# Patient Record
Sex: Male | Born: 1955 | Race: White | Hispanic: No | Marital: Married
Health system: Southern US, Community
[De-identification: ages and names within clinical notes are randomized; demographics above are authoritative.]

---

## 2016-10-14 DIAGNOSIS — D473 Essential (hemorrhagic) thrombocythemia: Secondary | ICD-10-CM | POA: Insufficient documentation

## 2016-11-12 DIAGNOSIS — D72829 Elevated white blood cell count, unspecified: Secondary | ICD-10-CM | POA: Diagnosis not present

## 2016-11-12 DIAGNOSIS — E611 Iron deficiency: Secondary | ICD-10-CM | POA: Diagnosis not present

## 2016-11-12 DIAGNOSIS — D473 Essential (hemorrhagic) thrombocythemia: Secondary | ICD-10-CM | POA: Diagnosis not present

## 2016-11-12 DIAGNOSIS — Z716 Tobacco abuse counseling: Secondary | ICD-10-CM | POA: Diagnosis not present

## 2016-11-26 DIAGNOSIS — F17210 Nicotine dependence, cigarettes, uncomplicated: Secondary | ICD-10-CM | POA: Diagnosis not present

## 2016-11-26 DIAGNOSIS — D473 Essential (hemorrhagic) thrombocythemia: Secondary | ICD-10-CM | POA: Diagnosis not present

## 2016-11-26 DIAGNOSIS — E611 Iron deficiency: Secondary | ICD-10-CM | POA: Diagnosis not present

## 2016-11-26 DIAGNOSIS — K089 Disorder of teeth and supporting structures, unspecified: Secondary | ICD-10-CM | POA: Diagnosis not present

## 2016-11-26 DIAGNOSIS — R109 Unspecified abdominal pain: Secondary | ICD-10-CM | POA: Diagnosis not present

## 2016-12-17 DIAGNOSIS — D473 Essential (hemorrhagic) thrombocythemia: Secondary | ICD-10-CM | POA: Diagnosis not present

## 2016-12-17 DIAGNOSIS — R110 Nausea: Secondary | ICD-10-CM | POA: Diagnosis not present

## 2016-12-17 DIAGNOSIS — E875 Hyperkalemia: Secondary | ICD-10-CM | POA: Diagnosis not present

## 2016-12-17 DIAGNOSIS — Z862 Personal history of diseases of the blood and blood-forming organs and certain disorders involving the immune mechanism: Secondary | ICD-10-CM | POA: Diagnosis not present

## 2017-11-11 DIAGNOSIS — D473 Essential (hemorrhagic) thrombocythemia: Secondary | ICD-10-CM

## 2017-11-11 DIAGNOSIS — Z79899 Other long term (current) drug therapy: Secondary | ICD-10-CM

## 2018-07-23 DIAGNOSIS — I340 Nonrheumatic mitral (valve) insufficiency: Secondary | ICD-10-CM

## 2018-07-23 DIAGNOSIS — D440 Neoplasm of uncertain behavior of thyroid gland: Secondary | ICD-10-CM

## 2018-07-23 DIAGNOSIS — I2510 Atherosclerotic heart disease of native coronary artery without angina pectoris: Secondary | ICD-10-CM

## 2018-07-23 DIAGNOSIS — S72144A Nondisplaced intertrochanteric fracture of right femur, initial encounter for closed fracture: Secondary | ICD-10-CM

## 2018-07-23 DIAGNOSIS — M25551 Pain in right hip: Secondary | ICD-10-CM

## 2018-07-23 DIAGNOSIS — D473 Essential (hemorrhagic) thrombocythemia: Secondary | ICD-10-CM

## 2018-07-24 DIAGNOSIS — D473 Essential (hemorrhagic) thrombocythemia: Secondary | ICD-10-CM

## 2018-07-26 DIAGNOSIS — D473 Essential (hemorrhagic) thrombocythemia: Secondary | ICD-10-CM

## 2018-08-02 DIAGNOSIS — D473 Essential (hemorrhagic) thrombocythemia: Secondary | ICD-10-CM

## 2018-12-13 DIAGNOSIS — D473 Essential (hemorrhagic) thrombocythemia: Secondary | ICD-10-CM

## 2019-10-19 DIAGNOSIS — D473 Essential (hemorrhagic) thrombocythemia: Secondary | ICD-10-CM

## 2019-10-19 DIAGNOSIS — D72829 Elevated white blood cell count, unspecified: Secondary | ICD-10-CM

## 2019-10-19 DIAGNOSIS — D509 Iron deficiency anemia, unspecified: Secondary | ICD-10-CM

## 2020-02-28 ENCOUNTER — Other Ambulatory Visit: Payer: Self-pay | Admitting: Hematology and Oncology

## 2020-02-28 DIAGNOSIS — D473 Essential (hemorrhagic) thrombocythemia: Secondary | ICD-10-CM

## 2020-02-29 ENCOUNTER — Inpatient Hospital Stay: Payer: Self-pay | Attending: Hematology and Oncology

## 2020-02-29 ENCOUNTER — Inpatient Hospital Stay: Payer: Self-pay | Admitting: Hematology and Oncology

## 2020-02-29 NOTE — Progress Notes (Deleted)
[facility]  [address] [place],  [place]  [address] [phone]  Clinic Day:  02/29/2020  Referring physician: No ref. provider found   CHIEF COMPLAINT:  CC: essential thrombocythemia  Current Treatment:   hydroxyurea   HISTORY OF PRESENT ILLNESS:  Jack Bryan is a 65 y.o. male with essential thrombocythemia.  He had not seen a doctor for 45 years, but gave a history of a dangerously high platelet count in February 2018, for which he placed on some form of pills by a hematologist.  He stopped taking those on his own and his platelet count was found to be over 1 million in September 2018.  We began seeing him in October, at which time his platelet count was over 1.87 million.  He had evidence of iron deficiency, but was not anemic. He also had leukocytosis. We recommended that he start hydroxyurea 500 mg 3 times daily.  JAK 2 mutation was negative.  His platelet count had come down from 1.87 million to 815,000 later in October, but we still recommended increasing the hydroxyurea to 4 times daily.  In November, his platelet count had normalized at 268,000, so we decreased the hydroxyurea to 3 times daily.  He did not come back for follow up after that.  His wife died in 01-08-2017 and he became depressed and stopped taking his medication and was not eating.  He developed flu-like symptoms  He has a history of coronary artery disease and had coronary artery bypass surgery in February 2018.  He seemed to understand that he is at high risk for cardiovascular events and blood clots with the high platelet count.  He had  He has poor dental hygiene and most likely needs extractions of his teeth.  He is on some form of heart medication but could not tell me what.  He was seen by Dr. Lyda Jester the following week for iron deficiency anemia as well.  He was scheduled for upper endoscopy and colonoscopy but needed to hold his Plavix first.  He has chronic  pain of his back and leg and was attending a pain management Clinic last year.  He was seen on November 14th with a normal platelet count of 268,000 and I told him he could reduce his hydroxyurea dose to 500 mg 3 times daily.  He never came back for follow-up and stopped taking the medication.  He explains to me that his wife expired in 01/08/17 and he became quite depressed and gave up on everything.  He admits he was not eating or taking his medications.   He developed flu-like symptoms.  His primary care provider checked a blood count in September 2019 and the platelet count was 2.6 million.  A repeat the following week was even higher at 2.76 million with a white count of 34,000 and hemoglobin of 13.9.  When repeated again, the white count was 30,000, but the platelet count was over 3 million.  The lab results were forwarded to [place], so we contacted the patient and he agreed to get back on medication.  We told him we could give him a month supply so that he could get an appointment here, but that we could not continue to fill this unless he followed up with [place].  He started back on hydroxyurea 500 mg 4 times daily in September.  He was seen in October 2019 with improvement of his thrombocytosis with a platelet count of 828,000.  Unfortunately,  he never came back for follow up and discontinued the hydroxyurea once he ran out.   He had a fall in June 2020 and was seen in the ER with right hip pain.  X-ray revealed that he had a nondisplaced right intertrochanteric fracture.  He underwent surgery to have a intramedullary nailing of the fracture. His platelet count was over 2 million at that time, so he was placed on hydroxyurea 500 mg, 6 pills daily.  Hydroxyurea was decreased to 4 times daily when he left the hospital.  We saw him in June 2020 after he was discharged from the hospital.  His platelet count was 1.48 million, so we had him continue hydroxyurea 500 mg 4 times daily.   He was seen in November  2020 and his platelet count had normalized again, so we decreased the hydroxyurea to 3 times daily.  He then failed to follow up again.   He was seen in September 2021 and his platelet count was 122,000, so we held hydroxyurea.  We recommended a repeat CBC in 1 month prior to resuming hydroxyurea, but he did not come in for that appointment.    INTERVAL HISTORY:  Jack Bryan is here today for routine follow up of his ***. He denies fevers or chills. He  denies pain. His appetite is good. His weight {Weight change:10426}.  REVIEW OF SYSTEMS:  Review of Systems - Oncology   VITALS:  There were no vitals taken for this visit.  Wt Readings from Last 3 Encounters:  No data found for Wt    There is no height or weight on file to calculate BMI.  Performance status (ECOG): {CHL ONC Q3448304  PHYSICAL EXAM:  Physical Exam  LABS:  No flowsheet data found. No flowsheet data found.   No results found for: CEA1 / No results found for: CEA1 No results found for: PSA1 No results found for: IOE703 No results found for: CAN125  No results found for: TOTALPROTELP, ALBUMINELP, A1GS, A2GS, BETS, BETA2SER, GAMS, MSPIKE, SPEI No results found for: TIBC, FERRITIN, IRONPCTSAT No results found for: LDH  STUDIES:  No results found.    HISTORY:  No past medical history on file.  *** The histories are not reviewed yet. Please review them in the "History" navigator section and refresh this [place].  No family history on file.  Social History:  has no history on file for tobacco use, alcohol use, and drug use.The patient is {Blank single:19197::"alone","accompanied by"} *** today.  Allergies: Not on File  Current Medications: No current outpatient medications on file.   No current facility-administered medications for this visit.     ASSESSMENT & PLAN:   Assessment:  Errin Chewning is a 65 y.o. male ***  Plan: ***  The patient understands the plans discussed today and is in  agreement with them.  He knows to contact our office if he develops concerns prior to his next appointment.   I provided *** minutes (10:58 AM - 10:58 AM) of face-to-face time during this this encounter and > 50% was spent counseling as documented under my assessment and plan.    Marvia Pickles, PA-C

## 2020-07-04 ENCOUNTER — Ambulatory Visit: Payer: Self-pay | Admitting: Hematology and Oncology

## 2020-07-04 ENCOUNTER — Other Ambulatory Visit: Payer: Self-pay

## 2020-07-04 NOTE — Progress Notes (Deleted)
[facility]  [address] [place],    [address] [phone]  Clinic Day:  07/04/2020  Referring physician: No ref. provider found   CHIEF COMPLAINT:  CC: ***  Current Treatment:  ***   HISTORY OF PRESENT ILLNESS:  Jack Bryan is a 65 y.o. male with a essential thrombocythemia who we began seeing in October 2018.  He reported not seeing a doctor for 45 years, but in February 2018 he had a dangerously high platelet count and was placed on some form of pills by a hematologist.  He stopped taking those on his own and his platelet count was found to be over 1 million, thenup to 1.95 million in September 2018 prior to seeing [place].  He had evidence of iron deficiency, but was not anemic, and also had leukocytosis. He was seen by Dr. Lyda Jester for iron deficiency anemia.   He recommended an EGD and colonoscopy, which was never done. We started him on hydroxyurea at 500 mg 3 times daily.  He was found to be negative for JAK 2 mutation. His platelet count remained elevated over a million later in October, so the hydroxyurea was increased to 4 times daily. He was seen in November 2018 with a normal platelet count of 268,000, so we reduced his hydroxyurea dose to 500 mg 3 times daily.  We typically give a 6 month supply of medication at a time.  He did not follow-up and stopped taking the medication when he ran out.  He saw his primary care provider in September 2019 and had a platelet count of 2.6 million, the platelet count continued to rise over several weeks and he had persistent leukocytosis, so the lab results were sent to [place]. We contacted the patient and he agreed to get back on hydroxyurea 500 mg 4 times daily.  We gave him a 1 month supply until he could get an appointment here, stating we could not continue to fill this unless he came in for appointments.  He was seen in October 2019 with improvement of his thrombocytosis, but never came back for  follow up and once again discontinued hydroxyurea when he ran out.   He had a fall in June 2020, and went to the ER with right hip pain.  X-ray revealed that he had a nondisplaced right intertrochanteric fracture, and he underwent surgery to have a intramedullary nailing of the fracture. His platelet count was over 2 million, so he was placed back on hydroxyurea 6 pills daily initially, and then 4 times daily when he left the hospital.  We saw him later in June and continued hydroxyurea 4 times daily, recommending he be seen in 1-2 months.  He finally followed up in November, at which time his platelet count and white count were normal, so we decreased the hydroxyurea to 3 times daily and recommended follow-up in 3-4 months. Once again, he failed to follow-up despite multiple phone calls and letters from our office.  He requested a refill of hydroxyurea in May 2021 and we gave him a month's supply until he could be seen.  He was not seen until September , at which time his platelet count was 122,000 and hemoglobin 10.8.  We therefore held hydroxyurea and planned to repeat a CBC in 1 month. He did not come for that appointment.  He was hospitalized at [facility] on May 19th with pneumonia secondary to influenza.  He was also felt to have a  transient ischemic attack and a non STEMI acute MI. He was found to have a platelet count of 1.66 million, so started on hydroxyurea 500 mg 3 times daily.  He left the hospital against medical advice on May 22nd at which time his platelet count was 1.35 million. He did follow-up with [facility] transitional care yesterday.  No labs were drawn, but he was continued on metoprolol 12.5 mg twice daily and aspirin 81 mg daily, in addition to the hydroxyurea.  He was instructed to keep his appointment with [place] today.  INTERVAL HISTORY:  Jack Bryan is here today for repeat clinical assessment. He denies fevers or chills. He denies pain. His appetite is  good. His weight {Weight change:10426}.  REVIEW OF SYSTEMS:  Review of Systems  Constitutional: Negative for appetite change, chills, fatigue, fever and unexpected weight change.  HENT:   Negative for lump/mass, mouth sores and sore throat.   Respiratory: Negative for cough and shortness of breath.   Cardiovascular: Negative for chest pain and leg swelling.  Gastrointestinal: Negative for abdominal pain, constipation, diarrhea, nausea and vomiting.  Genitourinary: Negative for difficulty urinating, dysuria, frequency and hematuria.   Musculoskeletal: Negative for arthralgias, back pain and myalgias.  Skin: Negative for itching, rash and wound.  Neurological: Negative for dizziness, extremity weakness, headaches, light-headedness and numbness.  Hematological: Negative for adenopathy.  Psychiatric/Behavioral: Negative for depression and sleep disturbance. The patient is not nervous/anxious.      VITALS:  There were no vitals taken for this visit.  Wt Readings from Last 3 Encounters:  No data found for Wt    There is no height or weight on file to calculate BMI.  Performance status (ECOG): {CHL ONC [id number]  PHYSICAL EXAM:  Physical Exam Vitals and nursing note reviewed.  Constitutional:      General: He is not in acute distress.    Appearance: Normal appearance. He is normal weight.  HENT:     Head: Normocephalic and atraumatic.     Mouth/Throat:     Mouth: Mucous membranes are moist.     Pharynx: Oropharynx is clear. No oropharyngeal exudate or posterior oropharyngeal erythema.  Eyes:     General: No scleral icterus.    Extraocular Movements: Extraocular movements intact.     Conjunctiva/sclera: Conjunctivae normal.     Pupils: Pupils are equal, round, and reactive to light.  Cardiovascular:     Rate and Rhythm: Normal rate and regular rhythm.     Heart sounds: Normal heart sounds. No murmur heard. No friction rub. No gallop.   Pulmonary:     Effort: Pulmonary  effort is normal.     Breath sounds: Normal breath sounds. No wheezing, rhonchi or rales.  Chest:  Breasts:     Right: No axillary adenopathy or supraclavicular adenopathy.     Left: No axillary adenopathy or supraclavicular adenopathy.    Abdominal:     General: Bowel sounds are normal. There is no distension.     Palpations: Abdomen is soft. There is no hepatomegaly, splenomegaly or mass.     Tenderness: There is no abdominal tenderness.  Musculoskeletal:        General: Normal range of motion.     Cervical back: Normal range of motion and neck supple. No tenderness.     Right lower leg: No edema.     Left lower leg: No edema.  Lymphadenopathy:     Cervical: No cervical adenopathy.     Upper Body:     Right  upper body: No supraclavicular or axillary adenopathy.     Left upper body: No supraclavicular or axillary adenopathy.     Lower Body: No right inguinal adenopathy. No left inguinal adenopathy.  Skin:    General: Skin is warm and dry.     Coloration: Skin is not jaundiced.     Findings: No rash.  Neurological:     Mental Status: He is alert and oriented to person, place, and time.     Cranial Nerves: No cranial nerve deficit.  Psychiatric:        Mood and Affect: Mood normal.        Behavior: Behavior normal.        Thought Content: Thought content normal.     LABS:  No flowsheet data found. No flowsheet data found.   No results found for: CEA1 / No results found for: CEA1 No results found for: PSA1 No results found for: MHD622 No results found for: CAN125  No results found for: TOTALPROTELP, ALBUMINELP, A1GS, A2GS, BETS, BETA2SER, GAMS, MSPIKE, SPEI No results found for: TIBC, FERRITIN, IRONPCTSAT No results found for: LDH  STUDIES:  No results found.    HISTORY:  No past medical history on file.  *** The histories are not reviewed yet. Please review them in the "History" navigator section and refresh this [place].  No family history on  file.  Social History:  has no history on file for tobacco use, alcohol use, and drug use.The patient is {Blank single:19197::"alone","accompanied by"} *** today.  Allergies: Not on File  Current Medications: No current outpatient medications on file.   No current facility-administered medications for this visit.     ASSESSMENT & PLAN:   Assessment:  Jack Bryan is a 65 y.o. male ***  Plan: ***  The patient understands the plans discussed today and is in agreement with them.  He knows to contact our office if he develops concerns prior to his next appointment.   I provided *** minutes (8:51 AM - 8:51 AM) of face-to-face time during this this encounter and > 50% was spent counseling as documented under my assessment and plan.    Marvia Pickles, PA-C

## 2020-08-14 ENCOUNTER — Inpatient Hospital Stay: Payer: Medicaid Other

## 2020-08-14 ENCOUNTER — Inpatient Hospital Stay: Payer: Medicaid Other | Attending: Oncology | Admitting: Oncology

## 2020-09-20 DIAGNOSIS — D473 Essential (hemorrhagic) thrombocythemia: Secondary | ICD-10-CM

## 2020-09-26 ENCOUNTER — Telehealth: Payer: Self-pay | Admitting: Hematology and Oncology

## 2020-09-26 NOTE — Telephone Encounter (Signed)
Patient's daughter called to scheduled Hospital Follow Up after discharge on 8/20.  Appt made 8/25 Labs 1:30 pm, Hosp F/U w/Kelli 2:00 pm

## 2020-09-27 ENCOUNTER — Other Ambulatory Visit: Payer: Medicaid Other

## 2020-09-27 ENCOUNTER — Ambulatory Visit: Payer: Medicaid Other | Admitting: Hematology and Oncology

## 2020-10-02 ENCOUNTER — Other Ambulatory Visit: Payer: Medicaid Other

## 2020-10-02 ENCOUNTER — Ambulatory Visit: Payer: Medicaid Other | Admitting: Hematology and Oncology

## 2020-10-02 DIAGNOSIS — I361 Nonrheumatic tricuspid (valve) insufficiency: Secondary | ICD-10-CM

## 2020-10-03 DIAGNOSIS — I340 Nonrheumatic mitral (valve) insufficiency: Secondary | ICD-10-CM

## 2020-10-03 DIAGNOSIS — I420 Dilated cardiomyopathy: Secondary | ICD-10-CM

## 2020-10-03 DIAGNOSIS — I272 Pulmonary hypertension, unspecified: Secondary | ICD-10-CM

## 2020-10-03 DIAGNOSIS — I5020 Unspecified systolic (congestive) heart failure: Secondary | ICD-10-CM

## 2020-10-03 DIAGNOSIS — J441 Chronic obstructive pulmonary disease with (acute) exacerbation: Secondary | ICD-10-CM

## 2020-10-03 DIAGNOSIS — I2510 Atherosclerotic heart disease of native coronary artery without angina pectoris: Secondary | ICD-10-CM

## 2020-10-03 DIAGNOSIS — J9600 Acute respiratory failure, unspecified whether with hypoxia or hypercapnia: Secondary | ICD-10-CM

## 2020-10-04 ENCOUNTER — Encounter (HOSPITAL_COMMUNITY)
Admission: AD | Disposition: A | Discharge status: Home / Self Care | Payer: Self-pay | Source: Other Acute Inpatient Hospital | Attending: Student

## 2020-10-04 ENCOUNTER — Inpatient Hospital Stay (HOSPITAL_COMMUNITY): Payer: Medicaid Other

## 2020-10-04 ENCOUNTER — Encounter (HOSPITAL_COMMUNITY): Payer: Self-pay | Admitting: Internal Medicine

## 2020-10-04 ENCOUNTER — Inpatient Hospital Stay (HOSPITAL_COMMUNITY)
Admission: AD | Admit: 2020-10-04 | Discharge: 2020-10-05 | DRG: 286 | Disposition: A | Discharge status: Home / Self Care | Payer: Medicaid Other | Source: Other Acute Inpatient Hospital | Attending: Student | Admitting: Student

## 2020-10-04 DIAGNOSIS — J449 Chronic obstructive pulmonary disease, unspecified: Secondary | ICD-10-CM | POA: Diagnosis present

## 2020-10-04 DIAGNOSIS — D75839 Thrombocytosis, unspecified: Secondary | ICD-10-CM | POA: Diagnosis present

## 2020-10-04 DIAGNOSIS — R000 Tachycardia, unspecified: Secondary | ICD-10-CM

## 2020-10-04 DIAGNOSIS — I110 Hypertensive heart disease with heart failure: Secondary | ICD-10-CM | POA: Diagnosis present

## 2020-10-04 DIAGNOSIS — G8929 Other chronic pain: Secondary | ICD-10-CM | POA: Diagnosis present

## 2020-10-04 DIAGNOSIS — I255 Ischemic cardiomyopathy: Secondary | ICD-10-CM | POA: Diagnosis present

## 2020-10-04 DIAGNOSIS — I2510 Atherosclerotic heart disease of native coronary artery without angina pectoris: Secondary | ICD-10-CM | POA: Diagnosis present

## 2020-10-04 DIAGNOSIS — Z951 Presence of aortocoronary bypass graft: Secondary | ICD-10-CM | POA: Diagnosis not present

## 2020-10-04 DIAGNOSIS — R911 Solitary pulmonary nodule: Secondary | ICD-10-CM | POA: Diagnosis present

## 2020-10-04 DIAGNOSIS — Z8673 Personal history of transient ischemic attack (TIA), and cerebral infarction without residual deficits: Secondary | ICD-10-CM

## 2020-10-04 DIAGNOSIS — R64 Cachexia: Secondary | ICD-10-CM | POA: Diagnosis present

## 2020-10-04 DIAGNOSIS — F17200 Nicotine dependence, unspecified, uncomplicated: Secondary | ICD-10-CM | POA: Diagnosis present

## 2020-10-04 DIAGNOSIS — I083 Combined rheumatic disorders of mitral, aortic and tricuspid valves: Secondary | ICD-10-CM | POA: Diagnosis present

## 2020-10-04 DIAGNOSIS — Z681 Body mass index (BMI) 19 or less, adult: Secondary | ICD-10-CM

## 2020-10-04 DIAGNOSIS — Z20822 Contact with and (suspected) exposure to covid-19: Secondary | ICD-10-CM | POA: Diagnosis present

## 2020-10-04 DIAGNOSIS — E785 Hyperlipidemia, unspecified: Secondary | ICD-10-CM | POA: Diagnosis present

## 2020-10-04 DIAGNOSIS — D751 Secondary polycythemia: Secondary | ICD-10-CM | POA: Diagnosis present

## 2020-10-04 DIAGNOSIS — I5023 Acute on chronic systolic (congestive) heart failure: Secondary | ICD-10-CM | POA: Diagnosis present

## 2020-10-04 DIAGNOSIS — I509 Heart failure, unspecified: Secondary | ICD-10-CM

## 2020-10-04 DIAGNOSIS — E43 Unspecified severe protein-calorie malnutrition: Secondary | ICD-10-CM | POA: Diagnosis present

## 2020-10-04 DIAGNOSIS — I5041 Acute combined systolic (congestive) and diastolic (congestive) heart failure: Secondary | ICD-10-CM

## 2020-10-04 DIAGNOSIS — Z720 Tobacco use: Secondary | ICD-10-CM

## 2020-10-04 HISTORY — PX: RIGHT/LEFT HEART CATH AND CORONARY/GRAFT ANGIOGRAPHY: CATH118267

## 2020-10-04 LAB — POCT I-STAT 7, (LYTES, BLD GAS, ICA,H+H)
Acid-Base Excess: 2 mmol/L (ref 0.0–2.0)
Bicarbonate: 25.3 mmol/L (ref 20.0–28.0)
Calcium, Ion: 0.71 mmol/L — CL (ref 1.15–1.40)
HCT: 32 % — ABNORMAL LOW (ref 39.0–52.0)
Hemoglobin: 10.9 g/dL — ABNORMAL LOW (ref 13.0–17.0)
O2 Saturation: 95 %
Potassium: 2.6 mmol/L — CL (ref 3.5–5.1)
Sodium: 149 mmol/L — ABNORMAL HIGH (ref 135–145)
TCO2: 26 mmol/L (ref 22–32)
pCO2 arterial: 34.2 mmHg (ref 32.0–48.0)
pH, Arterial: 7.477 — ABNORMAL HIGH (ref 7.350–7.450)
pO2, Arterial: 68 mmHg — ABNORMAL LOW (ref 83.0–108.0)

## 2020-10-04 LAB — POCT I-STAT EG7
Acid-Base Excess: 9 mmol/L — ABNORMAL HIGH (ref 0.0–2.0)
Acid-Base Excess: 7 mmol/L — ABNORMAL HIGH (ref 0.0–2.0)
Bicarbonate: 33.8 mmol/L — ABNORMAL HIGH (ref 20.0–28.0)
Bicarbonate: 31.4 mmol/L — ABNORMAL HIGH (ref 20.0–28.0)
Calcium, Ion: 1.13 mmol/L — ABNORMAL LOW (ref 1.15–1.40)
Calcium, Ion: 0.99 mmol/L — ABNORMAL LOW (ref 1.15–1.40)
HCT: 42 % (ref 39.0–52.0)
HCT: 39 % (ref 39.0–52.0)
Hemoglobin: 14.3 g/dL (ref 13.0–17.0)
Hemoglobin: 13.3 g/dL (ref 13.0–17.0)
O2 Saturation: 58 %
O2 Saturation: 62 %
Potassium: 3.7 mmol/L (ref 3.5–5.1)
Potassium: 3.2 mmol/L — ABNORMAL LOW (ref 3.5–5.1)
Sodium: 140 mmol/L (ref 135–145)
Sodium: 143 mmol/L (ref 135–145)
TCO2: 35 mmol/L — ABNORMAL HIGH (ref 22–32)
TCO2: 33 mmol/L — ABNORMAL HIGH (ref 22–32)
pCO2, Ven: 47.2 mmHg (ref 44.0–60.0)
pCO2, Ven: 43.9 mmHg — ABNORMAL LOW (ref 44.0–60.0)
pH, Ven: 7.463 — ABNORMAL HIGH (ref 7.250–7.430)
pH, Ven: 7.462 — ABNORMAL HIGH (ref 7.250–7.430)
pO2, Ven: 29 mmHg — CL (ref 32.0–45.0)
pO2, Ven: 31 mmHg — CL (ref 32.0–45.0)

## 2020-10-04 LAB — ECHOCARDIOGRAM COMPLETE
AR max vel: 3.08 cm2
AV Area VTI: 2.39 cm2
AV Area mean vel: 2.61 cm2
AV Mean grad: 5.2 mmHg
AV Peak grad: 8.5 mmHg
Ao pk vel: 1.46 m/s
Area-P 1/2: 8.25 cm2
Calc EF: 12.8 %
Height: 72 in
MV M vel: 5.11 m/s
MV Peak grad: 104.4 mmHg
Radius: 0.7 cm
S' Lateral: 6 cm
Single Plane A2C EF: 13 %
Single Plane A4C EF: 14.5 %
Weight: 1886.4 [oz_av]

## 2020-10-04 LAB — CBC WITH DIFFERENTIAL/PLATELET
Abs Immature Granulocytes: 0 K/uL (ref 0.00–0.07)
Basophils Absolute: 0 K/uL (ref 0.0–0.1)
Basophils Relative: 0 %
Eosinophils Absolute: 0.1 K/uL (ref 0.0–0.5)
Eosinophils Relative: 1 %
HCT: 41 % (ref 39.0–52.0)
Hemoglobin: 13.1 g/dL (ref 13.0–17.0)
Lymphocytes Relative: 15 %
Lymphs Abs: 2 K/uL (ref 0.7–4.0)
MCH: 32.2 pg (ref 26.0–34.0)
MCHC: 32 g/dL (ref 30.0–36.0)
MCV: 100.7 fL — ABNORMAL HIGH (ref 80.0–100.0)
Monocytes Absolute: 0.5 K/uL (ref 0.1–1.0)
Monocytes Relative: 4 %
Neutro Abs: 10.6 K/uL — ABNORMAL HIGH (ref 1.7–7.7)
Neutrophils Relative %: 80 %
Platelets: 771 K/uL — ABNORMAL HIGH (ref 150–400)
RBC: 4.07 MIL/uL — ABNORMAL LOW (ref 4.22–5.81)
RDW: 24.5 % — ABNORMAL HIGH (ref 11.5–15.5)
WBC: 13.2 K/uL — ABNORMAL HIGH (ref 4.0–10.5)
nRBC: 0 % (ref 0.0–0.2)
nRBC: 0 /100{WBCs}

## 2020-10-04 LAB — BRAIN NATRIURETIC PEPTIDE: B Natriuretic Peptide: 1084.9 pg/mL — ABNORMAL HIGH (ref 0.0–100.0)

## 2020-10-04 LAB — COMPREHENSIVE METABOLIC PANEL WITH GFR
ALT: 27 U/L (ref 0–44)
AST: 20 U/L (ref 15–41)
Albumin: 3.3 g/dL — ABNORMAL LOW (ref 3.5–5.0)
Alkaline Phosphatase: 80 U/L (ref 38–126)
Anion gap: 9 (ref 5–15)
BUN: 17 mg/dL (ref 8–23)
CO2: 30 mmol/L (ref 22–32)
Calcium: 8.7 mg/dL — ABNORMAL LOW (ref 8.9–10.3)
Chloride: 97 mmol/L — ABNORMAL LOW (ref 98–111)
Creatinine, Ser: 0.65 mg/dL (ref 0.61–1.24)
GFR, Estimated: >60 mL/min
Glucose, Bld: 101 mg/dL — ABNORMAL HIGH (ref 70–99)
Potassium: 3.7 mmol/L (ref 3.5–5.1)
Sodium: 136 mmol/L (ref 135–145)
Total Bilirubin: 0.9 mg/dL (ref 0.3–1.2)
Total Protein: 5.8 g/dL — ABNORMAL LOW (ref 6.5–8.1)

## 2020-10-04 LAB — PROTIME-INR
INR: 1.2 (ref 0.8–1.2)
Prothrombin Time: 15.5 s — ABNORMAL HIGH (ref 11.4–15.2)

## 2020-10-04 LAB — SARS CORONAVIRUS 2 BY RT PCR (HOSPITAL ORDER, PERFORMED IN ~~LOC~~ HOSPITAL LAB): SARS Coronavirus 2: NEGATIVE

## 2020-10-04 LAB — HIV ANTIBODY (ROUTINE TESTING W REFLEX): HIV Screen 4th Generation wRfx: NONREACTIVE

## 2020-10-04 LAB — MAGNESIUM: Magnesium: 2.1 mg/dL (ref 1.7–2.4)

## 2020-10-04 LAB — TSH: TSH: 3.921 u[IU]/mL (ref 0.350–4.500)

## 2020-10-04 SURGERY — RIGHT/LEFT HEART CATH AND CORONARY/GRAFT ANGIOGRAPHY
Anesthesia: LOCAL

## 2020-10-04 MED ORDER — FUROSEMIDE 10 MG/ML IJ SOLN
40.0000 mg | Freq: Two times a day (BID) | INTRAMUSCULAR | Status: DC
  Administered 2020-10-04: 40 mg via INTRAVENOUS
  Filled 2020-10-04: qty 4

## 2020-10-04 MED ORDER — SODIUM CHLORIDE 0.9 % IV SOLN: INTRAVENOUS | Status: AC

## 2020-10-04 MED ORDER — LIDOCAINE HCL (PF) 1 % IJ SOLN
INTRAMUSCULAR | Status: DC | PRN
  Administered 2020-10-04 (×2): 2 mL

## 2020-10-04 MED ORDER — HEPARIN (PORCINE) IN NACL 1000-0.9 UT/500ML-% IV SOLN
INTRAVENOUS | Status: DC | PRN
  Administered 2020-10-04 (×2): 500 mL

## 2020-10-04 MED ORDER — HEPARIN SODIUM (PORCINE) 1000 UNIT/ML IJ SOLN
INTRAMUSCULAR | Status: AC
  Filled 2020-10-04: qty 1

## 2020-10-04 MED ORDER — LIDOCAINE HCL (PF) 1 % IJ SOLN
INTRAMUSCULAR | Status: AC
  Filled 2020-10-04: qty 30

## 2020-10-04 MED ORDER — FENTANYL CITRATE (PF) 100 MCG/2ML IJ SOLN
INTRAMUSCULAR | Status: AC
  Filled 2020-10-04: qty 2

## 2020-10-04 MED ORDER — SODIUM CHLORIDE 0.9% FLUSH: 3.0000 mL | INTRAVENOUS | Status: DC | PRN

## 2020-10-04 MED ORDER — MIDAZOLAM HCL 2 MG/2ML IJ SOLN
INTRAMUSCULAR | Status: AC
  Filled 2020-10-04: qty 2

## 2020-10-04 MED ORDER — ATORVASTATIN CALCIUM 80 MG PO TABS
80.0000 mg | ORAL_TABLET | Freq: Every day | ORAL | Status: DC
  Administered 2020-10-04 – 2020-10-05 (×2): 80 mg via ORAL
  Filled 2020-10-04 (×2): qty 1

## 2020-10-04 MED ORDER — SODIUM CHLORIDE 0.9% FLUSH
3.0000 mL | Freq: Two times a day (BID) | INTRAVENOUS | Status: DC
  Administered 2020-10-04 – 2020-10-05 (×3): 3 mL via INTRAVENOUS

## 2020-10-04 MED ORDER — SODIUM CHLORIDE 0.9 % IV SOLN: 250.0000 mL | INTRAVENOUS | Status: DC | PRN

## 2020-10-04 MED ORDER — IOHEXOL 350 MG/ML SOLN
INTRAVENOUS | Status: DC | PRN
  Administered 2020-10-04: 80 mL via INTRA_ARTERIAL

## 2020-10-04 MED ORDER — SODIUM CHLORIDE 0.9% FLUSH
3.0000 mL | Freq: Two times a day (BID) | INTRAVENOUS | Status: DC
  Administered 2020-10-04 – 2020-10-05 (×2): 3 mL via INTRAVENOUS

## 2020-10-04 MED ORDER — ONDANSETRON HCL 4 MG/2ML IJ SOLN: 4.0000 mg | Freq: Four times a day (QID) | INTRAMUSCULAR | Status: DC | PRN

## 2020-10-04 MED ORDER — HEPARIN SODIUM (PORCINE) 1000 UNIT/ML IJ SOLN
INTRAMUSCULAR | Status: DC | PRN
  Administered 2020-10-04: 2500 [IU] via INTRAVENOUS

## 2020-10-04 MED ORDER — ACETAMINOPHEN 325 MG PO TABS: 650.0000 mg | ORAL_TABLET | ORAL | Status: DC | PRN

## 2020-10-04 MED ORDER — CAPTOPRIL 12.5 MG PO TABS
6.2500 mg | ORAL_TABLET | Freq: Three times a day (TID) | ORAL | Status: DC
  Filled 2020-10-04: qty 0.5

## 2020-10-04 MED ORDER — HEPARIN (PORCINE) IN NACL 1000-0.9 UT/500ML-% IV SOLN
INTRAVENOUS | Status: AC
  Filled 2020-10-04: qty 1000

## 2020-10-04 MED ORDER — SODIUM CHLORIDE 0.9 % IV SOLN: INTRAVENOUS | Status: DC

## 2020-10-04 MED ORDER — FENTANYL CITRATE (PF) 100 MCG/2ML IJ SOLN
INTRAMUSCULAR | Status: DC | PRN
  Administered 2020-10-04 (×3): 25 ug via INTRAVENOUS

## 2020-10-04 MED ORDER — ASPIRIN EC 81 MG PO TBEC
81.0000 mg | DELAYED_RELEASE_TABLET | Freq: Every day | ORAL | Status: DC
  Administered 2020-10-05: 81 mg via ORAL
  Filled 2020-10-04: qty 1

## 2020-10-04 MED ORDER — ENOXAPARIN SODIUM 40 MG/0.4ML IJ SOSY: 40.0000 mg | PREFILLED_SYRINGE | INTRAMUSCULAR | Status: DC

## 2020-10-04 MED ORDER — ENOXAPARIN SODIUM 40 MG/0.4ML IJ SOSY
40.0000 mg | PREFILLED_SYRINGE | INTRAMUSCULAR | Status: DC
  Administered 2020-10-05: 40 mg via SUBCUTANEOUS
  Filled 2020-10-04: qty 0.4

## 2020-10-04 MED ORDER — ACETAMINOPHEN 325 MG PO TABS
650.0000 mg | ORAL_TABLET | ORAL | Status: DC | PRN
  Administered 2020-10-05: 650 mg via ORAL
  Filled 2020-10-04: qty 2

## 2020-10-04 MED ORDER — HYDRALAZINE HCL 20 MG/ML IJ SOLN: 10.0000 mg | INTRAMUSCULAR | Status: AC | PRN

## 2020-10-04 MED ORDER — VERAPAMIL HCL 2.5 MG/ML IV SOLN
INTRAVENOUS | Status: AC
  Filled 2020-10-04: qty 2

## 2020-10-04 MED ORDER — MELATONIN 3 MG PO TABS
3.0000 mg | ORAL_TABLET | Freq: Every day | ORAL | Status: DC
  Administered 2020-10-04: 3 mg via ORAL
  Filled 2020-10-04: qty 1

## 2020-10-04 MED ORDER — LABETALOL HCL 5 MG/ML IV SOLN: 10.0000 mg | INTRAVENOUS | Status: AC | PRN

## 2020-10-04 MED ORDER — MIDAZOLAM HCL 2 MG/2ML IJ SOLN
INTRAMUSCULAR | Status: DC | PRN
  Administered 2020-10-04: 1 mg via INTRAVENOUS

## 2020-10-04 MED ORDER — OXYCODONE HCL 5 MG PO TABS
5.0000 mg | ORAL_TABLET | ORAL | Status: DC | PRN
  Administered 2020-10-04 – 2020-10-05 (×9): 5 mg via ORAL
  Filled 2020-10-04 (×9): qty 1

## 2020-10-04 MED ORDER — VERAPAMIL HCL 2.5 MG/ML IV SOLN
INTRAVENOUS | Status: DC | PRN
  Administered 2020-10-04: 10 mL via INTRA_ARTERIAL

## 2020-10-04 MED ORDER — ASPIRIN 81 MG PO CHEW: 81.0000 mg | CHEWABLE_TABLET | ORAL | Status: DC

## 2020-10-04 MED ORDER — ASPIRIN 81 MG PO CHEW
81.0000 mg | CHEWABLE_TABLET | ORAL | Status: AC
  Administered 2020-10-04: 81 mg via ORAL
  Filled 2020-10-04: qty 1

## 2020-10-04 MED ORDER — HYDROXYUREA 500 MG PO CAPS
500.0000 mg | ORAL_CAPSULE | Freq: Three times a day (TID) | ORAL | Status: DC
  Administered 2020-10-04 – 2020-10-05 (×4): 500 mg via ORAL
  Filled 2020-10-04 (×6): qty 1

## 2020-10-04 SURGICAL SUPPLY — 14 items
CATH BALLN WEDGE 5F 110CM (CATHETERS) ×2 IMPLANT
CATH INFINITI 5 FR JL3.5 (CATHETERS) ×2 IMPLANT
CATH INFINITI 5 FR RCB (CATHETERS) ×2 IMPLANT
CATH INFINITI 5FR AL1 (CATHETERS) ×2 IMPLANT
CATH INFINITI 5FR MULTPACK ANG (CATHETERS) ×2 IMPLANT
DEVICE RAD TR BAND REGULAR (VASCULAR PRODUCTS) ×2 IMPLANT
GLIDESHEATH SLEND SS 6F .021 (SHEATH) ×2 IMPLANT
GUIDEWIRE INQWIRE 1.5J.035X260 (WIRE) ×1 IMPLANT
INQWIRE 1.5J .035X260CM (WIRE) ×2
KIT HEART LEFT (KITS) ×2 IMPLANT
PACK CARDIAC CATHETERIZATION (CUSTOM PROCEDURE TRAY) ×2 IMPLANT
SHEATH GLIDE SLENDER 4/5FR (SHEATH) ×2 IMPLANT
TRANSDUCER W/STOPCOCK (MISCELLANEOUS) ×2 IMPLANT
TUBING CIL FLEX 10 FLL-RA (TUBING) ×2 IMPLANT

## 2020-10-04 NOTE — Progress Notes (Signed)
  Echocardiogram 2D Echocardiogram has been performed.  Bobbye Charleston 10/04/2020, 9:36 AM

## 2020-10-04 NOTE — Progress Notes (Signed)
Pt. Arrived from [facility] via [place]. Pt. Alert and oriented. No distress noted. [facility] admits paged to make aware.

## 2020-10-04 NOTE — Consult Note (Addendum)
Advanced Heart Failure Team Consult Note   Primary Physician: Patient, No Pcp Per (Inactive) PCP-Cardiologist:  None  Reason for Consultation: A/C HFrEF  HPI:    Jack Bryan is seen today for evaluation of heart failure  at the request of Dr Blossom Hoops .  Jack Bryan is a 65 y.o. male with COPD, CABG 2018, ongoing tobacco use, LUL pulmonary nodule (1.2 cm),  thrombocytosis, malnutrition, and chronic systolic heart failure.   Had CABG 2018 at [facility].He underwent LIMA --> LAD, SVG --> PDA, and SVG --> OM and required re-exploration for sternal hematoma but otherwise did well.  Has not seen cardiology since that time. EF at that time was 20%.    Admitted from [facility] for advanced heart failure management.  CXR with moderate R pleural effusion. CTA negative for PE. ECHO showed EF < 20%. Cardiology consulted with recommendation for transfer for Advanced Heart Failure Team to consult. Received dose of IV lasix.    Review of Systems: [y] = yes, '[ ]'$  = no   General: Weight gain '[ ]'$ ; Weight loss [ Y]; Anorexia '[ ]'$ ; Fatigue '[ ]'$ ; Fever '[ ]'$ ; Chills '[ ]'$ ; Weakness '[ ]'$   Cardiac: Chest pain/pressure '[ ]'$ ; Resting SOB '[ ]'$ ; Exertional SOB [ Y]; Orthopnea '[ ]'$ ; Pedal Edema '[ ]'$ ; Palpitations '[ ]'$ ; Syncope '[ ]'$ ; Presyncope '[ ]'$ ; Paroxysmal nocturnal dyspnea'[ ]'$   Pulmonary: Cough '[ ]'$ ; Wheezing'[ ]'$ ; Hemoptysis'[ ]'$ ; Sputum '[ ]'$ ; Snoring '[ ]'$   GI: Vomiting'[ ]'$ ; Dysphagia'[ ]'$ ; Melena'[ ]'$ ; Hematochezia '[ ]'$ ; Heartburn'[ ]'$ ; Abdominal pain '[ ]'$ ; Constipation '[ ]'$ ; Diarrhea '[ ]'$ ; BRBPR '[ ]'$   GU: Hematuria'[ ]'$ ; Dysuria '[ ]'$ ; Nocturia'[ ]'$   Vascular: Pain in legs with walking '[ ]'$ ; Pain in feet with lying flat '[ ]'$ ; Non-healing sores '[ ]'$ ; Stroke '[ ]'$ ; TIA '[ ]'$ ; Slurred speech '[ ]'$ ;  Neuro: Headaches'[ ]'$ ; Vertigo'[ ]'$ ; Seizures'[ ]'$ ; Paresthesias'[ ]'$ ;Blurred vision '[ ]'$ ; Diplopia '[ ]'$ ; Vision changes '[ ]'$   Ortho/Skin: Arthritis '[ ]'$ ; Joint pain [ Y]; Muscle pain '[ ]'$ ; Joint swelling '[ ]'$ ; Back Pain [ Y]; Rash '[ ]'$   Psych:  Depression'[ ]'$ ; Anxiety'[ ]'$   Heme: Bleeding problems '[ ]'$ ; Clotting disorders '[ ]'$ ; Anemia '[ ]'$   Endocrine: Diabetes '[ ]'$ ; Thyroid dysfunction'[ ]'$   Home Medications Prior to Admission medications   Medication Sig Start Date End Date Taking? Authorizing Provider  albuterol (VENTOLIN HFA) 108 (90 Base) MCG/ACT inhaler Inhale 1-2 puffs into the lungs every 6 (six) hours as needed for wheezing or shortness of breath.   Yes [provider]  Aspirin-Salicylamide-Caffeine (BC HEADACHE POWDER PO) Take 1 Package by mouth 2 (two) times daily as needed (headache/pain).   Yes [provider]  hydroxyurea (HYDREA) 500 MG capsule Take 1,000 mg by mouth 3 (three) times daily. 09/22/20  Yes [provider]  metoprolol tartrate (LOPRESSOR) 25 MG tablet Take 12.5 mg by mouth daily. 09/22/20  Yes [provider]  levofloxacin (LEVAQUIN) 500 MG tablet Take 500 mg by mouth daily. 7 day supply 09/22/20   [provider]    Past Medical History: CABG 2018  HTN Hyperlipidemia  Sinus Tach  Tobacco Abuse Chronic HFrEF  Past Surgical History: CABG 2018   Family History: No family history of coronary disease.   Social History: Social History   Socioeconomic History   Marital status: Married    Spouse name: Not on file   Number of children: Not on file   Years of  education: Not on file   Highest education level: Not on file  Occupational History   Not on file  Tobacco Use   Smoking status: Not on file   Smokeless tobacco: Not on file  Substance and Sexual Activity   Alcohol use: Not on file   Drug use: Not on file   Sexual activity: Not on file  Other Topics Concern   Not on file  Social History Narrative   Not on file   Social Determinants of Health   Financial Resource Strain: Medium Risk   Difficulty of Paying Living Expenses: Somewhat hard  Food Insecurity: Food Insecurity Present   Worried About Running Out of Food in the Last Year: Never true   Ran  Out of Food in the Last Year: Sometimes true  Transportation Needs: No Transportation Needs   Lack of Transportation (Medical): No   Lack of Transportation (Non-Medical): No  Physical Activity: Not on file  Stress: Not on file  Social Connections: Not on file    Allergies:  No Known Allergies  Objective:    Vital Signs:   Temp:  [97.8 F (36.6 C)-97.9 F (36.6 C)] 97.8 F (36.6 C) (09/01 0624) Pulse Rate:  [119-120] 120 (09/01 0624) Resp:  [18-19] 19 (09/01 0624) BP: (112-125)/(85-86) 125/85 (09/01 0624) SpO2:  [97 %-100 %] 97 % (09/01 1220) Weight:  [53.5 kg] 53.5 kg (09/01 0813) Last BM Date: 10/03/20  Weight change: Filed Weights   10/04/20 0110 10/04/20 0813  Weight: 53.5 kg 53.5 kg    Intake/Output:   Intake/Output Summary (Last 24 hours) at 10/04/2020 1300 Last data filed at 10/04/2020 1100 Gross per 24 hour  Intake 240 ml  Output 1950 ml  Net -1710 ml      Physical Exam    General:  Cachetic.  No resp difficulty HEENT: normal Neck: supple. JVP 7-8  Carotids 2+ bilat; no bruits. No lymphadenopathy or thyromegaly appreciated. Cor: PMI nondisplaced. Tachy, Regular rate & rhythm. No rubs, or murmurs. +S3 Lungs: clear Abdomen: soft, nontender, nondistended. No hepatosplenomegaly. No bruits or masses. Good bowel sounds. Extremities: no cyanosis, clubbing, rash, edema Neuro: alert & orientedx3, cranial nerves grossly intact. moves all 4 extremities w/o difficulty. Affect pleasant   Telemetry   Sinus Tach 120   EKG    N/A  Labs   Basic Metabolic Panel: Recent Labs  Lab 10/04/20 0709  NA 136  K 3.7  CL 97*  CO2 30  GLUCOSE 101*  BUN 17  CREATININE 0.65  CALCIUM 8.7*  MG 2.1    Liver Function Tests: Recent Labs  Lab 10/04/20 0709  AST 20  ALT 27  ALKPHOS 80  BILITOT 0.9  PROT 5.8*  ALBUMIN 3.3*   No results for input(s): LIPASE, AMYLASE in the last 168 hours. No results for input(s): AMMONIA in the last 168 hours.  CBC: Recent  Labs  Lab 10/04/20 0709  WBC 13.2*  NEUTROABS 10.6*  HGB 13.1  HCT 41.0  MCV 100.7*  PLT 771*    Cardiac Enzymes: No results for input(s): CKTOTAL, CKMB, CKMBINDEX, TROPONINI in the last 168 hours.  BNP: BNP (last 3 results) Recent Labs    10/04/20 0709  BNP 1,084.9*    ProBNP (last 3 results) No results for input(s): PROBNP in the last 8760 hours.   CBG: No results for input(s): GLUCAP in the last 168 hours.  Coagulation Studies: Recent Labs    10/04/20 0709  LABPROT 15.5*  INR 1.2  Imaging   ECHOCARDIOGRAM COMPLETE  Result Date: 10/04/2020    ECHOCARDIOGRAM REPORT   Patient Name:   Jack Bryan Date of Exam: 10/04/2020 Medical Rec #:  [id number]            Height:       72.0 in Accession #:    [id number]           Weight:       117.9 lb Date of Birth:  [birth year]            BSA:          1.702 m Patient Age:    60 years             BP:           125/85 mmHg Patient Gender: M                    HR:           117 bpm. Exam Location:  Inpatient Procedure: 2D Echo, 3D Echo, Cardiac Doppler and Color Doppler Indications:    I50.40* Unspecified combined systolic (congestive) and diastolic                 (congestive) heart failure  History:        Patient has no prior history of Echocardiogram examinations.  Sonographer:    Roseanna Rainbow RDCS Referring Phys: [id number] [place]  1. Left ventricular ejection fraction, by estimation, is <20%. The left ventricle has severely decreased function. The left ventricle demonstrates global hypokinesis. The left ventricular internal cavity size was moderately dilated. There is moderate left ventricular hypertrophy. Left ventricular diastolic parameters are indeterminate.  2. Right ventricular systolic function is normal. The right ventricular size is moderately enlarged. There is normal pulmonary artery systolic pressure.  3. Left atrial size was mildly dilated.  4. Right atrial size was moderately dilated.  5. The mitral  valve is normal in structure. Moderate to severe mitral valve regurgitation. No evidence of mitral stenosis.  6. The aortic valve is tricuspid. There is moderate calcification of the aortic valve. There is moderate thickening of the aortic valve. Aortic valve regurgitation is trivial. Mild to moderate aortic valve sclerosis/calcification is present, without any  evidence of aortic stenosis.  7. Aortic dilatation noted. There is mild dilatation of the ascending aorta, measuring 44 mm.  8. The inferior vena cava is dilated in size with >50% respiratory variability, suggesting right atrial pressure of 8 mmHg. FINDINGS  Left Ventricle: Left ventricular ejection fraction, by estimation, is <20%. The left ventricle has severely decreased function. The left ventricle demonstrates global hypokinesis. The left ventricular internal cavity size was moderately dilated. There is moderate left ventricular hypertrophy. Left ventricular diastolic parameters are indeterminate. Right Ventricle: The right ventricular size is moderately enlarged. No increase in right ventricular wall thickness. Right ventricular systolic function is normal. There is normal pulmonary artery systolic pressure. The tricuspid regurgitant velocity is 2.47 m/s, and with an assumed right atrial pressure of 3 mmHg, the estimated right ventricular systolic pressure is [id number]. Left Atrium: Left atrial size was mildly dilated. Right Atrium: Right atrial size was moderately dilated. Pericardium: There is no evidence of pericardial effusion. Mitral Valve: The mitral valve is normal in structure. Moderate to severe mitral valve regurgitation. No evidence of mitral valve stenosis. Tricuspid Valve: The tricuspid valve is normal in structure. Tricuspid valve regurgitation is mild . No evidence of tricuspid stenosis. Aortic Valve: The  aortic valve is tricuspid. There is moderate calcification of the aortic valve. There is moderate thickening of the aortic valve.  Aortic valve regurgitation is trivial. Mild to moderate aortic valve sclerosis/calcification is present, without any evidence of aortic stenosis. Aortic valve mean gradient measures 5.2 mmHg. Aortic valve peak gradient measures 8.5 mmHg. Aortic valve area, by VTI measures 2.39 cm. Pulmonic Valve: The pulmonic valve was normal in structure. Pulmonic valve regurgitation is not visualized. No evidence of pulmonic stenosis. Aorta: Aortic dilatation noted. There is mild dilatation of the ascending aorta, measuring 44 mm. Venous: The inferior vena cava is dilated in size with greater than 50% respiratory variability, suggesting right atrial pressure of 8 mmHg. IAS/Shunts: No atrial level shunt detected by color flow Doppler.  LEFT VENTRICLE PLAX 2D LVIDd:         6.40 cm      Diastology LVIDs:         6.00 cm      LV e' medial:    6.64 cm/s LV PW:         1.60 cm      LV E/e' medial:  14.9 LV IVS:        1.20 cm      LV e' lateral:   9.79 cm/s LVOT diam:     2.20 cm      LV E/e' lateral: 10.1 LV SV:         46 LV SV Index:   27 LVOT Area:     3.80 cm                              3D Volume EF: LV Volumes (MOD)            3D EF:        21 % LV vol d, MOD A2C: 230.0 ml LV EDV:       251 ml LV vol d, MOD A4C: 220.0 ml LV ESV:       198 ml LV vol s, MOD A2C: 200.0 ml LV SV:        53 ml LV vol s, MOD A4C: 188.0 ml LV SV MOD A2C:     30.0 ml LV SV MOD A4C:     220.0 ml LV SV MOD BP:      30.3 ml RIGHT VENTRICLE            IVC RV S prime:     8.32 cm/s  IVC diam: 2.20 cm TAPSE (M-mode): 0.9 cm LEFT ATRIUM             Index       RIGHT ATRIUM           Index LA diam:        3.10 cm 1.82 cm/m  RA Area:     23.60 cm LA Vol (A2C):   70.7 ml 41.54 ml/m RA Volume:   91.90 ml  54.00 ml/m LA Vol (A4C):   61.4 ml 36.08 ml/m LA Biplane Vol: 71.7 ml 42.13 ml/m  AORTIC VALVE AV Area (Vmax):    3.08 cm AV Area (Vmean):   2.61 cm AV Area (VTI):     2.39 cm AV Vmax:           146.00 cm/s AV Vmean:          98.740 cm/s AV VTI:             0.193  m AV Peak Grad:      8.5 mmHg AV Mean Grad:      5.2 mmHg LVOT Vmax:         118.25 cm/s LVOT Vmean:        67.825 cm/s LVOT VTI:          0.121 m LVOT/AV VTI ratio: 0.63  AORTA Ao Root diam: 3.40 cm Ao Asc diam:  4.40 cm MITRAL VALVE                 TRICUSPID VALVE MV Area (PHT): 8.25 cm      TR Peak grad:   24.4 mmHg MV Decel Time: 92 msec       TR Vmax:        247.00 cm/s MR Peak grad:    104.4 mmHg MR Mean grad:    65.0 mmHg   SHUNTS MR Vmax:         511.00 cm/s Systemic VTI:  0.12 m MR Vmean:        368.0 cm/s  Systemic Diam: 2.20 cm MR PISA:         3.08 cm MR PISA Eff ROA: 23 mm MR PISA Radius:  0.70 cm MV E velocity: 99.00 cm/s Candee Furbish MD Electronically signed by Candee Furbish MD Signature Date/Time: 10/04/2020/11:39:36 AM    Final      Medications:     Current Medications:  [MAR Hold] aspirin EC  81 mg Oral Daily   [MAR Hold] atorvastatin  80 mg Oral Daily   [MAR Hold] enoxaparin (LOVENOX) injection  40 mg Subcutaneous Q24H   [MAR Hold] hydroxyurea  500 mg Oral TID   [MAR Hold] sodium chloride flush  3 mL Intravenous Q12H   [MAR Hold] sodium chloride flush  3 mL Intravenous Q12H    Infusions:  [MAR Hold] sodium chloride     sodium chloride     sodium chloride 10 mL/hr at 10/04/20 1055      Patient Profile   Jack Bryan is a 65 y.o. male with COPD, CABG 2018, ongoing tobacco use, LUL pulmonary nodule (1.2 cm),  thrombocytosis, malnutrition, and chronic systolic heart failure.    Admitted from [facility] for advanced heart failure management.     Assessment/Plan   Acute / Chronic Systolic Heart Failure , ICM  Had CABG 2018 . ECHO in 2018 EF 20% per Care Everywhere.  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 Will need CMRI.  - He does not appear volume overloaded but with tachycardia may represent low output. Extremities are warm.  - Mild volume overload. Hold diuretics for now. Adjust post cath.  - No bb for now.  - Will add dig -  Stop captopril. Anticipate adding ARNi.  -Set up RHC/LHC today to graft and  - Will need aggressive medical management.  - Will need cMRI -Would not be a candidate for advanced therapies with cachexia.    2. Sinus Tach -Check TSH -? Possible low output heart failure    3. S/P R thoracentesis at [facility] -Will need to find cytology report . Dr Haroldine Laws reaching out to Dr Harriet Masson to see we can get cytology.    4. . Tobacco Abuse -Discussed cessation    5. Malnutrition -Check prealbumin.  - Consult dietitian   6.  Thrombocytosis -On hyodrourea  -Followed by Dr Hinton Rao in [place]   7. COPD   8. H/O TIA 2022   9. Pulmonary Nodule LUL   10.  CAD  -S/P CABG 2018  LIMA --> LAD, SVG --> PDA, and SVG --> OM  He does not have established cardiologist.  - On asa and statin.    Cath later this morning. Will work on HF meds.      Length of Stay: 0   Darrick Grinder, NP  10/04/2020, 7:14 AM   Advanced Heart Failure Team Pager [phone] (M-F; 7a - 5p)  Please contact [facility] for night-coverage after hours (5p -7a ) and weekends on [email or web address]     Patient seen and examined with the above-signed Advanced Practice Provider and/or Housestaff. I personally reviewed laboratory data, imaging studies and relevant notes. I independently examined the patient and formulated the important aspects of the plan. I have edited the note to reflect any of my changes or salient points. I have personally discussed the plan with the patient and/or family.   65 y/o male with severe COPD, CAD s/p CABG, ischemic CM transferred from [facility] for further evaluation of HF.    Had CABG 2018. EF at that time 20%. Says he lost a lot of weight after that but never put it back on. Has not had regular cardiac f/u since.. says he has been relatively active under earlier this week.  Developed SOB. Went to [place] and had R thoracentesis with 800 cc off. Breathing back to baseline. No CP. EC EF ~20%   ROS: +  intermittent night sweats   General:  Thin appearing. No resp difficulty HEENT: normal Neck: supple. no JVD. Carotids 2+ bilat; no bruits. No lymphadenopathy or thryomegaly appreciated. Cor: PMI nondisplaced. Regular tachy No rubs, gallops or murmurs. Lungs: decreased throughout Abdomen: soft, nontender, nondistended. No hepatosplenomegaly. No bruits or masses. Good bowel sounds. Extremities: no cyanosis, clubbing, rash, edema Neuro: alert & orientedx3, cranial nerves grossly intact. moves all 4 extremities w/o difficulty. Affect pleasant   Suspect Ef may not be much different from baseline but exam certainly concerning for possible HF as significant contributor to his situation. Will plan R/L cath today. F/u pleural fluid to make sure cytology sent and no malignant cells.    Glori Bickers, MD  12:51 PM   Length of Stay: 0  Darrick Grinder, NP  10/04/2020, 1:00 PM  Advanced Heart Failure Team Pager [phone] (M-F; 7a - 5p)  Please contact [facility] for night-coverage after hours (4p -7a ) and weekends on [email or web address]

## 2020-10-04 NOTE — H&P (Signed)
Cardiology Admission History and Physical:   Patient ID: Jack Bryan MRN: [id number]; DOB: [birth year]   Admission date: 10/04/2020  PCP:  Patient, No Pcp Per (Inactive)   [facility] Providers Cardiologist:  None       Chief Complaint:  Heart Failure, transfer for further evaluation  Patient Profile:   Jack Bryan is a 65 y.o. male with COPD, CABG 2018, ongoing tobacco use, LUL pulmonary nodule (1.2 cm), and recently discovered HFrEF who is being seen 10/04/2020 for the evaluation of heart failure.  History of Present Illness:   Jack Bryan presented to outside emergency department three years ago with shortness of breath.  There he was found to have sinus tachycardia, large right sided with moderate left-sided pleural effusion, and fluid overload.  He was admitted and underwent thoracentesis with significant improvement in symptoms.  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  He underwent diuresis and was transferred here.  He still has exertional SOB but his resting dyspnea has improved.  He continues to have orthopnea, no PND, and no palpitations.  He denies any chest pain.  He did have an episode of severe abdominal upset about 1 month ago and was unable to eat and had profuse vomiting about 1 month ago which resolved on it's own.  He went into [facility] in 2018 after an episode of shortness of breath and chest pain while on the job in [place] in 2018 and underwent bypass grafting at that time.  He underwent LIMA --> LAD, SVG --> PDA, and SVG --> OM and required re-exploration for sternal hematoma but otherwise did well.  He didn't follow with cardiology consistently following bypass and it looks like hasn't seen one in the [facility] system since late mid 2018.  He was only taking hydroxyurea, the occasional aspirin, and oxycodone 5 mg  ever 6 hours for his chronic back pain.  He continues to smoke  PMHx CAD s/p CABG Polycythemia/thrombocytosis COPD Long-standing smoking  Medications Prior to Admission: Prior to Admission medications   Not on File     Allergies:   Not on File  Social History:   Lives with his daughter and grandaughter.  Retired now.  Long-standing smoking still active.  Family History:  Reviewed and nocontributory The patient's family history is not on file.    ROS:  Please see the history of present illness.  All other ROS reviewed and negative.     Physical Exam/Data:   Vitals:   10/04/20 0110 10/04/20 0114  BP:  112/86  Pulse:  (!) 119  Resp:  18  Temp:  97.9 F (36.6 C)  TempSrc:  Oral  SpO2:  100%  Weight: 53.5 kg   Height: 6' (1.829 m)    No intake or output data in the 24 hours ending 10/04/20 0535 Last 3 Weights 10/04/2020  Weight (lbs) 117 lb 14.4 oz  Weight (kg) 53.479 kg     Body mass index is 15.99 kg/m.  General:  Severely cachectic. HEENT: normal Lymph: no adenopathy Neck: External JVD clearly visible Endocrine:  No thryomegaly Vascular: No carotid bruits; FA pulses 2+ bilaterally without bruits  Cardiac:  Regular tachycardic rate with systolic murmur apex. Lungs:  clear to auscultation bilaterally, no wheezing, rhonchi or rales  Abd: soft, nontender, no hepatomegaly  Ext: no edema Musculoskeletal:  No deformities, BUE and BLE strength normal and equal Skin: warm and dry  Neuro:  CNs 2-12 intact, no focal abnormalities noted Psych:  Normal affect    EKG:  ECG pending  Relevant CV Studies: TTE OSH with LVEF <20 %, ?non-compaction cardiomyopathy, mod/severe eccentric MR  Laboratory Data:  High Sensitivity Troponin:  No results for input(s): TROPONINIHS in the last 720 hours.    ChemistryNo results for input(s): NA, K, CL, CO2, GLUCOSE, BUN, CREATININE, CALCIUM, GFRNONAA, GFRAA, ANIONGAP in the last 168 hours.  No results for input(s): PROT, ALBUMIN,  AST, ALT, ALKPHOS, BILITOT in the last 168 hours. HematologyNo results for input(s): WBC, RBC, HGB, HCT, MCV, MCH, MCHC, RDW, PLT in the last 168 hours. BNPNo results for input(s): BNP, PROBNP in the last 168 hours.  DDimer No results for input(s): DDIMER in the last 168 hours.  At outside facility, plt 700, normal WBC/Hgb, Cr 0.5  Radiology/Studies:  No results found.   Assessment and Plan:   Jack Bryan is a 65 y.o. male with COPD, CABG 2018, ongoing tobacco use, LUL pulmonary nodule (1.2 cm), and recently discovered HFrEF who is being seen 10/04/2020 for the evaluation of heart failure.  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  Additionally he was completely nonadherent following CABG so risk of graft failure is high.  Most prudent course would be to reimage with echocardiogram here and invasive angiography to redefine coronary anatomy as a first step.  On exam he is warm and appears well perfused with wide pulse pressure.  He does have concerning persistent sinus tachycardia.  He is also markedly cachectic which he explains has been present since the time of his bypass, but he also has finding up pulmonary nodule on recent CT chest.  Problem list Acute systolic and diastolic heart failure Coronary artery disease s/p CABG Essential thrombocytosis v. PV? COPD Ongoing tobacco use Severe malnutrition  Plan Acute systolic and diastolic heart failure, favor ischemic cardiomyopathy, NHYA IV - Hold on BB with ST, concern for low output - Start captopril 6.25 mg TID, titrate as tolerated - Add MRA if tolerates appropriate dose of above - TTE - Plan for echo, invasive angiography following this.  Coronary artery disease s/p CABG - Start ASA 81 mg daily - Start atorvastatin 80 mg daily - LHC as above  Essential thrombocytosis v. PV? - Continue 500 mg  hydroxyurea TID (recently dose reduced at OSH; previously on 1 g TID)  COPD - No exacerbation  Ongoing tobacco use - Discussed smoking cessation.  Precontemplative  Severe malnutrition - Nutrition consult  Risk Assessment/Risk Scores:       New York Heart Association (NYHA) Functional Class NYHA Class IV     Severity of Illness: The appropriate patient status for this patient is INPATIENT. Inpatient status is judged to be reasonable and necessary in order to provide the required intensity of service to ensure the patient's safety. The patient's presenting symptoms, physical exam findings, and initial radiographic and laboratory data in the context of their chronic comorbidities is felt to place them at high risk for further clinical deterioration. Furthermore, it is not anticipated that the patient will be medically stable for discharge from the hospital within 2 midnights of admission. The following factors support the patient status of inpatient.   " The patient's presenting symptoms include shortness of breath. " The worrisome physical exam findings include cachexia, sinus tachycardia, JVP elevation. " The initial radiographic and laboratory data  are worrisome because of severely reduced LVEF. " The chronic co-morbidities include essential thrombocytosis, coornary artery disease, severe malnutrition.   * I certify that at the point of admission it is my clinical judgment that the patient will require inpatient hospital care spanning beyond 2 midnights from the point of admission due to high intensity of service, high risk for further deterioration and high frequency of surveillance required.*   For questions or updates, please contact [place] Please consult [email or web address] for contact info under     Signed, Delight Hoh, MD  10/04/2020 5:35 AM

## 2020-10-04 NOTE — Plan of Care (Signed)
?  Problem: Coping: ?Goal: Level of anxiety will decrease ?Outcome: Progressing ?  ?Problem: Safety: ?Goal: Ability to remain free from injury will improve ?Outcome: Progressing ?  ?

## 2020-10-04 NOTE — Interval H&P Note (Signed)
History and Physical Interval Note:  10/04/2020 12:53 PM  [place]  has presented today for surgery, with the diagnosis of heart failure.  The various methods of treatment have been discussed with the patient and family. After consideration of risks, benefits and other options for treatment, the patient has consented to  Procedure(s): RIGHT/LEFT HEART CATH AND CORONARY/GRAFT ANGIOGRAPHY (N/A) and possible angioplastyas a surgical intervention.  The patient's history has been reviewed, patient examined, no change in status, stable for surgery.  I have reviewed the patient's chart and labs.  Questions were answered to the patient's satisfaction.     Jahkeem Kurka

## 2020-10-04 NOTE — TOC Initial Note (Signed)
Transition of Care (TOC) - Initial/Assessment Note  Heart Failure   Patient Details  Name: Jack Bryan MRN: [id number] Date of Birth: 07/28/1955  Transition of Care [facility]) CM/SW Contact:    [place], [place] Phone Number: 10/04/2020, 11:46 AM  Clinical Narrative:                 CSW spoke with the patient at bedside and completed a very brief SDOH with the patient who reported that he was transferred from [facility] and he signed Medicaid and disability paperwork there before being transferred to [facility]. Mr. Steininger stated that he receives $20 a month in Food Stamps which doesn't get him very far but that he lives with his granddaughter and daughter and they make sure there is food in the house. Mr. Bobick reported that he is still driving and doesn't have a PCP and would like assistance finding a PCP in [place], [place]. Mr. Furnas reports that he has been seeing Dr. Hinton Rao in [place] for heart care. Mr. Galyen reported that he uses the [facility] and sometimes has trouble paying for his medications due to lack of health insurance. CSW to follow up with Mr. Huesca and work on getting him a PCP and resources for support.  CSW will continue to follow throughout discharge.    Barriers to Discharge: Continued Medical Work up   Patient Goals and CMS Choice        Expected Discharge Plan and Services   In-house Referral: Clinical Social Work     Living arrangements for the past 2 months: [place]                                      Prior Living Arrangements/Services Living arrangements for the past 2 months: [place] with:: Self, Adult Children Patient language and need for interpreter reviewed:: Yes        Need for Family Participation in Patient Care: No (Comment) Care giver support system in place?: No (comment)   Criminal Activity/Legal Involvement Pertinent to Current Situation/Hospitalization: No - Comment as  needed  Activities of Daily Living Home Assistive Devices/Equipment: None    Permission Sought/Granted                  Emotional Assessment Appearance:: Appears stated age Attitude/Demeanor/Rapport: Engaged Affect (typically observed): Pleasant Orientation: : Oriented to Self, Oriented to Place, Oriented to  Time, Oriented to Situation   Psych Involvement: No (comment)  Admission diagnosis:  Acute on chronic heart failure ([place]) [I50.9] Patient Active Problem List   Diagnosis Date Noted   Acute on chronic heart failure ([place]) 10/04/2020   Thrombocythemia, essential ([place]) 10/14/2016   PCP:  Patient, No Pcp Per (Inactive) Pharmacy:   [facility] [address], [place] [id number] [place] [id number] Phone: [phone] Fax: [phone]     Social Determinants of Health (SDOH) Interventions Food Insecurity Interventions: Assist with SNAP Application, Other (Comment) (Patient reports he receives $20 in [place] each month which doesn't get him very far but his family helps with food) Financial Strain Interventions: Other (Comment), Development worker, community (Patient reports Disability and Medicaid application started at [facility] and they should be working on it and didn't want to start another one.) Housing Interventions: Intervention Not Indicated Transportation Interventions: Intervention Not Indicated  Readmission Risk Interventions No flowsheet data found.  Pharrell Ledford, MSW, [place] Heart Failure Social Worker

## 2020-10-04 NOTE — Plan of Care (Signed)
  Problem: Coping: Goal: Level of anxiety will decrease Outcome: Progressing   Problem: Safety: Goal: Ability to remain free from injury will improve 10/04/2020 2027 by Barton Dubois, RN Outcome: Progressing 10/04/2020 0807 by Barton Dubois, RN Outcome: Progressing

## 2020-10-04 NOTE — H&P (View-Only) (Signed)
Advanced Heart Failure Consult Note  PCP-Cardiologist: None   Subjective:    Admitted from [facility] for Advanced Heart Failure consult.   Complaining of back pain. Asking for pain medication.    Objective:   Weight Range: 53.5 kg Body mass index is 15.99 kg/m.   Vital Signs:   Temp:  [97.8 F (36.6 C)-97.9 F (36.6 C)] 97.8 F (36.6 C) (09/01 0624) Pulse Rate:  [119-120] 120 (09/01 0624) Resp:  [18-19] 19 (09/01 0624) BP: (112-125)/(85-86) 125/85 (09/01 0624) SpO2:  [99 %-100 %] 99 % (09/01 0624) Weight:  [53.5 kg] 53.5 kg (09/01 0110)    Weight change: Filed Weights   10/04/20 0110  Weight: 53.5 kg    Intake/Output:  No intake or output data in the 24 hours ending 10/04/20 0714    Physical Exam    General:  Cachectic. No resp difficulty HEENT: Normal Neck: Supple. JVP 7-8 Carotids 2+ bilat; no bruits. No lymphadenopathy or thyromegaly appreciated. Cor: PMI nondisplaced. Regular rate & rhythm. No rubs, or murmurs. + 3  Lungs: Crack   Abdomen: Soft, nontender, nondistended. No hepatosplenomegaly. No bruits or masses. Good bowel sounds. Extremities: No cyanosis, clubbing, rash, edema Neuro: Alert & orientedx3, cranial nerves grossly intact. moves all 4 extremities w/o difficulty. Affect pleasant   Telemetry   Sinus Tach 120s   EKG    Labs    CBC No results for input(s): WBC, NEUTROABS, HGB, HCT, MCV, PLT in the last 72 hours. Basic Metabolic Panel No results for input(s): NA, K, CL, CO2, GLUCOSE, BUN, CREATININE, CALCIUM, MG, PHOS in the last 72 hours. Liver Function Tests No results for input(s): AST, ALT, ALKPHOS, BILITOT, PROT, ALBUMIN in the last 72 hours. No results for input(s): LIPASE, AMYLASE in the last 72 hours. Cardiac Enzymes No results for input(s): CKTOTAL, CKMB, CKMBINDEX, TROPONINI in the last 72 hours.  BNP: BNP (last 3 results) No results for input(s): BNP in the last 8760 hours.  ProBNP (last 3 results) No  results for input(s): PROBNP in the last 8760 hours.   D-Dimer No results for input(s): DDIMER in the last 72 hours. Hemoglobin A1C No results for input(s): HGBA1C in the last 72 hours. Fasting Lipid Panel No results for input(s): CHOL, HDL, LDLCALC, TRIG, CHOLHDL, LDLDIRECT in the last 72 hours. Thyroid Function Tests No results for input(s): TSH, T4TOTAL, T3FREE, THYROIDAB in the last 72 hours.  Invalid input(s): FREET3  Other results:   Imaging    No results found.   Medications:     Scheduled Medications:  aspirin EC  81 mg Oral Daily   atorvastatin  80 mg Oral Daily   captopril  6.25 mg Oral TID   enoxaparin (LOVENOX) injection  40 mg Subcutaneous Q24H   furosemide  40 mg Intravenous BID   hydroxyurea  500 mg Oral TID   sodium chloride flush  3 mL Intravenous Q12H    Infusions:  sodium chloride      PRN Medications: sodium chloride, acetaminophen, ondansetron (ZOFRAN) IV, oxyCODONE, sodium chloride flush    Patient Profile  Jack Bryan is a 65 y.o. male with COPD, CABG 2018, ongoing tobacco use, LUL pulmonary nodule (1.2 cm),  thrombocytosis, malnutrition, and chronic systolic heart failure.   Admitted from [facility] for advanced heart failure management.   Assessment/Plan   Acute / Chronic Systolic Heart Failure , ICM  Had CABG 2018 . ECHO in 2018 EF 20% per Care Everywhere.  Echo at [facility] with EF <  20% possible noncompaction. Will need CMRI.  - He does not appear volume overloaded but with tachycardia may represent low output. Extremities are warm.  - Mild volume overload. Hold diuretics for now. Adjust post cath.  - No bb for now.  - Will add dig - Stop captopril. Anticipate adding ARNi.  -Set up RHC/LHC today to graft and  - Will need aggressive medical management.  - Will need cMRI -Would not be a candidate for advanced therapies with cachexia.   2. Sinus Tach -Check TSH -? Possible low output heart failure   3. S/P R  thoracentesis at [facility] -Will need to find cytology report . Dr Haroldine Laws reaching out to Dr Harriet Masson to see we can get cytology.   4. . Tobacco Abuse -Discussed cessation   5. Malnutrition -Check prealbumin.  - Consult dietitian  6.  Thrombocytosis -On hyodrourea  -Followed by Dr Hinton Rao in [place]  7. COPD  8. H/O TIA 2022  9. Pulmonary Nodule LUL  10. CAD  -S/P CABG 2018  LIMA --> LAD, SVG --> PDA, and SVG --> OM  He does not have established cardiologist.  - On asa and statin.   Cath later this morning. Will work on HF meds.    Length of Stay: 0  Darrick Grinder, NP  10/04/2020, 7:14 AM  Advanced Heart Failure Team Pager [phone] (M-F; 7a - 5p)  Please contact [facility] for night-coverage after hours (5p -7a ) and weekends on [email or web address]   Patient seen and examined with the above-signed Advanced Practice Provider and/or Housestaff. I personally reviewed laboratory data, imaging studies and relevant notes. I independently examined the patient and formulated the important aspects of the plan. I have edited the note to reflect any of my changes or salient points. I have personally discussed the plan with the patient and/or family.  65 y/o male with severe COPD, CAD s/p CABG, ischemic CM transferred from [facility] for further evaluation of HF.   Had CABG 2018. EF at that time 20%. Says he lost a lot of weight after that but never put it back on. Has not had regular cardiac f/u since.. says he has been relatively active under earlier this week.  Developed SOB. Went to [place] and had R thoracentesis with 800 cc off. Breathing back to baseline. No CP. EC EF ~20%  ROS: + intermittent night sweats  General:  Thin appearing. No resp difficulty HEENT: normal Neck: supple. no JVD. Carotids 2+ bilat; no bruits. No lymphadenopathy or thryomegaly appreciated. Cor: PMI nondisplaced. Regular tachy No rubs, gallops or murmurs. Lungs: decreased throughout Abdomen: soft,  nontender, nondistended. No hepatosplenomegaly. No bruits or masses. Good bowel sounds. Extremities: no cyanosis, clubbing, rash, edema Neuro: alert & orientedx3, cranial nerves grossly intact. moves all 4 extremities w/o difficulty. Affect pleasant  Suspect Ef may not be much different from baseline but exam certainly concerning for possible HF as significant contributor to his situation. Will plan R/L cath today. F/u pleural fluid to make sure cytology sent and no malignant cells.   Glori Bickers, MD  12:51 PM

## 2020-10-04 NOTE — Progress Notes (Addendum)
Advanced Heart Failure Consult Note  PCP-Cardiologist: None   Subjective:    Admitted from [facility] for Advanced Heart Failure consult.   Complaining of back pain. Asking for pain medication.    Objective:   Weight Range: 53.5 kg Body mass index is 15.99 kg/m.   Vital Signs:   Temp:  [97.8 F (36.6 C)-97.9 F (36.6 C)] 97.8 F (36.6 C) (09/01 0624) Pulse Rate:  [119-120] 120 (09/01 0624) Resp:  [18-19] 19 (09/01 0624) BP: (112-125)/(85-86) 125/85 (09/01 0624) SpO2:  [99 %-100 %] 99 % (09/01 0624) Weight:  [53.5 kg] 53.5 kg (09/01 0110)    Weight change: Filed Weights   10/04/20 0110  Weight: 53.5 kg    Intake/Output:  No intake or output data in the 24 hours ending 10/04/20 0714    Physical Exam    General:  Cachectic. No resp difficulty HEENT: Normal Neck: Supple. JVP 7-8 Carotids 2+ bilat; no bruits. No lymphadenopathy or thyromegaly appreciated. Cor: PMI nondisplaced. Regular rate & rhythm. No rubs, or murmurs. + 3  Lungs: Crack   Abdomen: Soft, nontender, nondistended. No hepatosplenomegaly. No bruits or masses. Good bowel sounds. Extremities: No cyanosis, clubbing, rash, edema Neuro: Alert & orientedx3, cranial nerves grossly intact. moves all 4 extremities w/o difficulty. Affect pleasant   Telemetry   Sinus Tach 120s   EKG    Labs    CBC No results for input(s): WBC, NEUTROABS, HGB, HCT, MCV, PLT in the last 72 hours. Basic Metabolic Panel No results for input(s): NA, K, CL, CO2, GLUCOSE, BUN, CREATININE, CALCIUM, MG, PHOS in the last 72 hours. Liver Function Tests No results for input(s): AST, ALT, ALKPHOS, BILITOT, PROT, ALBUMIN in the last 72 hours. No results for input(s): LIPASE, AMYLASE in the last 72 hours. Cardiac Enzymes No results for input(s): CKTOTAL, CKMB, CKMBINDEX, TROPONINI in the last 72 hours.  BNP: BNP (last 3 results) No results for input(s): BNP in the last 8760 hours.  ProBNP (last 3 results) No  results for input(s): PROBNP in the last 8760 hours.   D-Dimer No results for input(s): DDIMER in the last 72 hours. Hemoglobin A1C No results for input(s): HGBA1C in the last 72 hours. Fasting Lipid Panel No results for input(s): CHOL, HDL, LDLCALC, TRIG, CHOLHDL, LDLDIRECT in the last 72 hours. Thyroid Function Tests No results for input(s): TSH, T4TOTAL, T3FREE, THYROIDAB in the last 72 hours.  Invalid input(s): FREET3  Other results:   Imaging    No results found.   Medications:     Scheduled Medications:  aspirin EC  81 mg Oral Daily   atorvastatin  80 mg Oral Daily   captopril  6.25 mg Oral TID   enoxaparin (LOVENOX) injection  40 mg Subcutaneous Q24H   furosemide  40 mg Intravenous BID   hydroxyurea  500 mg Oral TID   sodium chloride flush  3 mL Intravenous Q12H    Infusions:  sodium chloride      PRN Medications: sodium chloride, acetaminophen, ondansetron (ZOFRAN) IV, oxyCODONE, sodium chloride flush    Patient Profile  Jack Bryan is a 65 y.o. male with COPD, CABG 2018, ongoing tobacco use, LUL pulmonary nodule (1.2 cm),  thrombocytosis, malnutrition, and chronic systolic heart failure.   Admitted from [facility] for advanced heart failure management.   Assessment/Plan   Acute / Chronic Systolic Heart Failure , ICM  Had CABG 2018 . ECHO in 2018 EF 20% per Care Everywhere.  Echo at [facility] with EF <  20% possible noncompaction. Will need CMRI.  - He does not appear volume overloaded but with tachycardia may represent low output. Extremities are warm.  - Mild volume overload. Hold diuretics for now. Adjust post cath.  - No bb for now.  - Will add dig - Stop captopril. Anticipate adding ARNi.  -Set up RHC/LHC today to graft and  - Will need aggressive medical management.  - Will need cMRI -Would not be a candidate for advanced therapies with cachexia.   2. Sinus Tach -Check TSH -? Possible low output heart failure   3. S/P R  thoracentesis at [facility] -Will need to find cytology report . Dr Haroldine Laws reaching out to Dr Harriet Masson to see we can get cytology.   4. . Tobacco Abuse -Discussed cessation   5. Malnutrition -Check prealbumin.  - Consult dietitian  6.  Thrombocytosis -On hyodrourea  -Followed by Dr Hinton Rao in [place]  7. COPD  8. H/O TIA 2022  9. Pulmonary Nodule LUL  10. CAD  -S/P CABG 2018  LIMA --> LAD, SVG --> PDA, and SVG --> OM  He does not have established cardiologist.  - On asa and statin.   Cath later this morning. Will work on HF meds.    Length of Stay: 0  Darrick Grinder, NP  10/04/2020, 7:14 AM  Advanced Heart Failure Team Pager [phone] (M-F; 7a - 5p)  Please contact [facility] for night-coverage after hours (5p -7a ) and weekends on [email or web address]   Patient seen and examined with the above-signed Advanced Practice Provider and/or Housestaff. I personally reviewed laboratory data, imaging studies and relevant notes. I independently examined the patient and formulated the important aspects of the plan. I have edited the note to reflect any of my changes or salient points. I have personally discussed the plan with the patient and/or family.  65 y/o male with severe COPD, CAD s/p CABG, ischemic CM transferred from [facility] for further evaluation of HF.   Had CABG 2018. EF at that time 20%. Says he lost a lot of weight after that but never put it back on. Has not had regular cardiac f/u since.. says he has been relatively active under earlier this week.  Developed SOB. Went to [place] and had R thoracentesis with 800 cc off. Breathing back to baseline. No CP. EC EF ~20%  ROS: + intermittent night sweats  General:  Thin appearing. No resp difficulty HEENT: normal Neck: supple. no JVD. Carotids 2+ bilat; no bruits. No lymphadenopathy or thryomegaly appreciated. Cor: PMI nondisplaced. Regular tachy No rubs, gallops or murmurs. Lungs: decreased throughout Abdomen: soft,  nontender, nondistended. No hepatosplenomegaly. No bruits or masses. Good bowel sounds. Extremities: no cyanosis, clubbing, rash, edema Neuro: alert & orientedx3, cranial nerves grossly intact. moves all 4 extremities w/o difficulty. Affect pleasant  Suspect Ef may not be much different from baseline but exam certainly concerning for possible HF as significant contributor to his situation. Will plan R/L cath today. F/u pleural fluid to make sure cytology sent and no malignant cells.   Glori Bickers, MD  12:51 PM

## 2020-10-05 ENCOUNTER — Inpatient Hospital Stay (HOSPITAL_COMMUNITY): Payer: Medicaid Other

## 2020-10-05 ENCOUNTER — Other Ambulatory Visit (HOSPITAL_COMMUNITY): Payer: Self-pay | Admitting: Physician Assistant

## 2020-10-05 ENCOUNTER — Other Ambulatory Visit (HOSPITAL_COMMUNITY): Payer: Self-pay

## 2020-10-05 DIAGNOSIS — R911 Solitary pulmonary nodule: Secondary | ICD-10-CM

## 2020-10-05 DIAGNOSIS — I5023 Acute on chronic systolic (congestive) heart failure: Secondary | ICD-10-CM

## 2020-10-05 LAB — BASIC METABOLIC PANEL
Anion gap: 8 (ref 5–15)
BUN: 20 mg/dL (ref 8–23)
CO2: 33 mmol/L — ABNORMAL HIGH (ref 22–32)
Calcium: 9.3 mg/dL (ref 8.9–10.3)
Chloride: 97 mmol/L — ABNORMAL LOW (ref 98–111)
Creatinine, Ser: 0.82 mg/dL (ref 0.61–1.24)
GFR, Estimated: >60 mL/min (ref >60)
Glucose, Bld: 104 mg/dL — ABNORMAL HIGH (ref 70–99)
Potassium: 4.2 mmol/L (ref 3.5–5.1)
Sodium: 138 mmol/L (ref 135–145)

## 2020-10-05 LAB — PREALBUMIN: Prealbumin: 18.9 mg/dL (ref 18–38)

## 2020-10-05 MED ORDER — DIGOXIN 125 MCG PO TABS
0.1250 mg | ORAL_TABLET | Freq: Every day | ORAL | 0 refills | Status: AC
  Filled 2020-10-05: qty 30, 30d supply, fill #0

## 2020-10-05 MED ORDER — CEFAZOLIN SODIUM-DEXTROSE 2-4 GM/100ML-% IV SOLN
INTRAVENOUS | Status: AC
  Filled 2020-10-05: qty 100

## 2020-10-05 MED ORDER — ASPIRIN 81 MG PO TBEC
81.0000 mg | DELAYED_RELEASE_TABLET | Freq: Every day | ORAL | 0 refills | Status: AC
  Filled 2020-10-05: qty 30, 30d supply, fill #0

## 2020-10-05 MED ORDER — ENSURE ENLIVE PO LIQD
237.0000 mL | Freq: Three times a day (TID) | ORAL | Status: DC
  Administered 2020-10-05: 237 mL via ORAL

## 2020-10-05 MED ORDER — HYDROXYUREA 500 MG PO CAPS: 500.0000 mg | ORAL_CAPSULE | Freq: Three times a day (TID) | ORAL | 0 refills | Status: AC

## 2020-10-05 MED ORDER — GADOBUTROL 1 MMOL/ML IV SOLN
5.0000 mL | Freq: Once | INTRAVENOUS | Status: AC | PRN
  Administered 2020-10-05: 5 mL via INTRAVENOUS

## 2020-10-05 MED ORDER — SPIRONOLACTONE 25 MG PO TABS
12.5000 mg | ORAL_TABLET | Freq: Every day | ORAL | 0 refills | Status: AC
  Filled 2020-10-05: qty 16, 32d supply, fill #0

## 2020-10-05 MED ORDER — CHLORHEXIDINE GLUCONATE 0.12 % MT SOLN
OROMUCOSAL | Status: AC
  Filled 2020-10-05: qty 15

## 2020-10-05 MED ORDER — SPIRONOLACTONE 12.5 MG HALF TABLET
12.5000 mg | ORAL_TABLET | Freq: Every day | ORAL | Status: DC
  Administered 2020-10-05: 12.5 mg via ORAL
  Filled 2020-10-05: qty 1

## 2020-10-05 MED ORDER — ACETAMINOPHEN 500 MG PO TABS
ORAL_TABLET | ORAL | Status: AC
  Filled 2020-10-05: qty 2

## 2020-10-05 MED ORDER — ADULT MULTIVITAMIN W/MINERALS CH
1.0000 | ORAL_TABLET | Freq: Every day | ORAL | Status: DC
  Administered 2020-10-05: 1 via ORAL

## 2020-10-05 MED ORDER — DIGOXIN 125 MCG PO TABS
0.1250 mg | ORAL_TABLET | Freq: Every day | ORAL | Status: DC
  Administered 2020-10-05: 0.125 mg via ORAL
  Filled 2020-10-05: qty 1

## 2020-10-05 MED ORDER — ATORVASTATIN CALCIUM 80 MG PO TABS
80.0000 mg | ORAL_TABLET | Freq: Every day | ORAL | 0 refills | Status: AC
  Filled 2020-10-05: qty 30, 30d supply, fill #0

## 2020-10-05 NOTE — TOC Transition Note (Signed)
Transition of Care [facility]) - CM/SW Discharge Note Heart Failure   Patient Details  Name: Jack Bryan MRN: [id number] Date of Birth: Feb 20, 1955  Transition of Care [facility]) CM/SW Contact:  [place], [place] Phone Number: 10/05/2020, 2:18 PM   Clinical Narrative:    CSW spoke with Mr. Jack Bryan at bedside and provided him with food resources for [place], [place] due to severe malnutrition. Mr. Jack Bryan reported that he receives only $20 in Food Stamps and that was just 3 months ago in which he received an EBT card from DSS. Mr. Jack Bryan reported filing disability and Medicaid at [facility] before being transferred to [facility]. Mr. Jack Bryan agreed to sign another disability application with the [facility] just in [facility] didn't take care of it. CSW reached out to [employer] to see about screening Mr. Jack Bryan for Medicaid as he doesn't have any health insurance. CSW provided Mr. Jack Bryan with an appointment card for the [facility] outpatient clinic and encouraged him to follow up and to attend the appointment and bring his medications and if anything changes to please reach out so that CSW/HV clinic team can provide support.  CSW will sign off for now as social work intervention is no longer needed. Please consult [place] again if new needs arise.   Final next level of care: Home/Self Care Barriers to Discharge: No Barriers Identified   Patient Goals and CMS Choice        Discharge Placement                       Discharge Plan and Services In-house Referral: Clinical Social Work Discharge Planning Services: CM Consult, [place] Program, Medication Assistance                                 Social Determinants of Health (SDOH) Interventions Food Insecurity Interventions: Assist with SNAP Application, Other (Comment) (Patient reports he receives $20 in Food Stamps each month which doesn't get him very far but his family helps with food) Financial  Strain Interventions: Other (Comment), Development worker, community (Patient reports Disability and Medicaid application started at [facility] and they should be working on it and didn't want to start another one.) Housing Interventions: Intervention Not Indicated Transportation Interventions: Intervention Not Indicated   Readmission Risk Interventions No flowsheet data found.   Jack Bryan, MSW, [place] Heart Failure Social Worker

## 2020-10-05 NOTE — Discharge Summary (Addendum)
Advanced Heart Failure Team  Discharge Summary   Patient ID: Jack Bryan MRN: [id number], DOB/AGE: 65-Jul-1957 65 y.o. Admit date: 10/04/2020 D/C date:     10/05/2020   Primary Discharge Diagnoses:  Acute on chronic systolic HF Cardiomyopathy, ischemia Sinus tachycardia Tobacco abuse Right pleural effusion Malnutrition Thrombocytosis COPD Hx TIA 10. Pulmonary nodule 11. CAD  Hospital Course: This is a 65 y.o. male with history of CABG in 2018 (LIMA to LAD, SVG to PDA, SVG to OM), HFrEF dating back to at least 2018, LUL pulmonary nodule (1.2 cm), thrombocytosis on hydroxyurea.  Patient presented to [facility] with shortness of breath. There he was found to have sinus tachycardia, large right sided with moderate left-sided pleural effusion, and fluid overload.  He was admitted and underwent thoracentesis with removal 800 cc fluid and significant improvement in symptoms.  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  He underwent diuresis and was transferred here.   Appeared compensated on arrival to [facility]. Echo here with EF < 20%, dilated RV with okay RV fxn, moderate to severe MR. R/LHC demonstrated normal filling pressures with moderately reduced CO, patent bypass grafts with exception of occluded SVG to RCA (medical management suggested). Cardiac MRI pending. He was initiated on digoxin and low-dose spiro. Not initiated on entresto d/t soft BP. Consider jardiance at f/u visit. No beta blocker d/t reduced CO. Arranged for f/u in advanced heart failure clinic.  Dr. Haroldine Laws reaching out to Dr. Harriet Masson to obtain cytology report for pleural fluid drained at [facility] especially given finding of LUL nodule. Will need outpatient referral to Pulmonary.  Patient cachetic and reported significant weight loss after CABG in 2018. Difficulty gaining weight, although reports  adequate po intake. Prealbumin 18. Seen by RD.   Seen by TOC/CM. Medicaid and disability paperwork pending from recent admit to [facility].   Unable to afford medications. Sent to [facility] pharmacy via HF fund at discharge. Can obtain hydroxyurea at [facility] for $15.  F/u scheduled in AHF clinic scheduled.  Received paperwork to apply for PCP through [facility] in [place].   HOSPITAL COURSE BY PROBLEM: Acute / Chronic Systolic Heart Failure/?ICM or mixed ICM/nonischemic -Had CABG 2018 . ECHO in 2018 EF 20% per Care Everywhere.  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  - Echo here with EF < 20%, moderate LVH, dilated RV with okay fxn, moderate to severe MR - cMRI pending. - Did not appear volume overloaded day of discharge. Not on loop diuretic. RA 6 and PCWP 15 on RHC. Moderately decreased CO.  - No bb for now.  - added dig - added low dose spiro - consider ARNI and jardiance at outpatient f/u - Will need aggressive medical management.  - Would not be a candidate for advanced therapies with cachexia. Prealbumin 18   2. Sinus Tach -TSH okay - Moderately depressed CO on RHC - Dig added as above   3. Mitral valve regurgitation: - Moderate to severe on echo this admit - Will need to follow   4. S/P R thoracentesis at [facility] this am with small bilateral pleural effusions - Will need to find cytology report especially with history of LUL pulmonary nodule.  - Dr Haroldine Laws reaching out to Dr Harriet Masson to see we can get cytology.    5. Tobacco Abuse -Discussed cessation    6. Malnutrition - Prealbumin 18 -  Consulted dietitian - appreciate input   7.  Thrombocytosis -On hydroxyurea  -platelets 771 -Followed by Dr Hinton Rao in [place]   8. COPD   9. H/O TIA 2022   10. Pulmonary Nodule LUL - Placed outpatient pulmonary referral   11. CAD  - S/P CABG 2018  LIMA --> LAD, SVG --> PDA, and SVG --> OM  - LHC 09/01 - patent  grafts with exception of occluded SVG to RCA, native RCA with mostly nonobstructive CAD - He does not have established cardiologist.  - On asa and statin.   Discharge Weight Range: 117>115 lb Discharge Vitals: Blood pressure 98/77, pulse 100, temperature 97.6 F (36.4 C), temperature source Oral, resp. rate 18, height 6' (1.829 m), weight 52.3 kg, SpO2 97 %.  Labs: Lab Results  Component Value Date   WBC 13.2 (H) 10/04/2020   HGB 14.3 10/04/2020   HCT 42.0 10/04/2020   MCV 100.7 (H) 10/04/2020   PLT 771 (H) 10/04/2020    Recent Labs  Lab 10/04/20 0709 10/04/20 1304 10/05/20 0350  NA 136   < > 138  K 3.7   < > 4.2  CL 97*  --  97*  CO2 30  --  33*  BUN 17  --  20  CREATININE 0.65  --  0.82  CALCIUM 8.7*  --  9.3  PROT 5.8*  --   --   BILITOT 0.9  --   --   ALKPHOS 80  --   --   ALT 27  --   --   AST 20  --   --   GLUCOSE 101*  --  104*   < > = values in this interval not displayed.   No results found for: CHOL, HDL, LDLCALC, TRIG BNP (last 3 results) Recent Labs    10/04/20 0709  BNP 1,084.9*    ProBNP (last 3 results) No results for input(s): PROBNP in the last 8760 hours.   Diagnostic Studies/Procedures   CARDIAC CATHETERIZATION  Result Date: 10/04/2020   Origin lesion is 100% stenosed.   Ost Cx to Prox Cx lesion is 100% stenosed.   Prox Cx to Mid Cx lesion is 80% stenosed.   Prox LAD lesion is 95% stenosed.   Prox RCA to Mid RCA lesion is 40% stenosed.   1st RPL lesion is 100% stenosed.   RPAV lesion is 40% stenosed.   The left ventricular ejection fraction is less than 25% by visual estimate. Findings: Ao = 104/70 (86) LV = 105/20 RA =  6 RV = 34/8 PA = 33/16 (24) PCW = 15 Fick cardiac output/index = 3.7/2.1 PVR = 2.5 WU SVR = 1753 Ao sat = 95% PA sat = 58%, 62% Assessment: Severe 3v CAD Patent LIMA to LAD and Patent SVG to OM Occluded SVG to RCA but native RCA with mostly nonobstructive CAD EF 20%  Moderately depressed CO with normal filling pressures  Plan/Discussion: Will check cMRI to further evaluate evaluate cardiomyopathy. Otherwise medical therapy. Glori Bickers, MD 1:57 PM  DG Chest Port 1 View  Result Date: 10/05/2020 CLINICAL DATA:  Acute on chronic heart failure. EXAM: PORTABLE CHEST 1 VIEW COMPARISON:  October 02, 2020. FINDINGS: Stable cardiomegaly. Sternotomy wires are noted. Mild bibasilar subsegmental atelectasis is noted with small pleural effusions. Fluid is noted in the right minor fissure. No pneumothorax is noted. Bony thorax is unremarkable. IMPRESSION: Mild bibasilar subsegmental atelectasis is noted with small bilateral pleural effusions. Electronically Signed   By: Sabino Dick  Jr M.D.   On: 10/05/2020 08:54   ECHOCARDIOGRAM COMPLETE  Result Date: 10/04/2020    ECHOCARDIOGRAM REPORT   Patient Name:   Jack Bryan Date of Exam: 10/04/2020 Medical Rec #:  [id number]            Height:       72.0 in Accession #:    [id number]           Weight:       117.9 lb Date of Birth:  Apr 26, 1955            BSA:          1.702 m Patient Age:    45 years             BP:           125/85 mmHg Patient Gender: M                    HR:           117 bpm. Exam Location:  Inpatient Procedure: 2D Echo, 3D Echo, Cardiac Doppler and Color Doppler Indications:    I50.40* Unspecified combined systolic (congestive) and diastolic                 (congestive) heart failure  History:        Patient has no prior history of Echocardiogram examinations.  Sonographer:    Roseanna Rainbow RDCS Referring Phys: [id number] [place]  1. Left ventricular ejection fraction, by estimation, is <20%. The left ventricle has severely decreased function. The left ventricle demonstrates global hypokinesis. The left ventricular internal cavity size was moderately dilated. There is moderate left ventricular hypertrophy. Left ventricular diastolic parameters are indeterminate.  2. Right ventricular systolic function is normal. The right ventricular size is moderately  enlarged. There is normal pulmonary artery systolic pressure.  3. Left atrial size was mildly dilated.  4. Right atrial size was moderately dilated.  5. The mitral valve is normal in structure. Moderate to severe mitral valve regurgitation. No evidence of mitral stenosis.  6. The aortic valve is tricuspid. There is moderate calcification of the aortic valve. There is moderate thickening of the aortic valve. Aortic valve regurgitation is trivial. Mild to moderate aortic valve sclerosis/calcification is present, without any  evidence of aortic stenosis.  7. Aortic dilatation noted. There is mild dilatation of the ascending aorta, measuring 44 mm.  8. The inferior vena cava is dilated in size with >50% respiratory variability, suggesting right atrial pressure of 8 mmHg. FINDINGS  Left Ventricle: Left ventricular ejection fraction, by estimation, is <20%. The left ventricle has severely decreased function. The left ventricle demonstrates global hypokinesis. The left ventricular internal cavity size was moderately dilated. There is moderate left ventricular hypertrophy. Left ventricular diastolic parameters are indeterminate. Right Ventricle: The right ventricular size is moderately enlarged. No increase in right ventricular wall thickness. Right ventricular systolic function is normal. There is normal pulmonary artery systolic pressure. The tricuspid regurgitant velocity is 2.47 m/s, and with an assumed right atrial pressure of 3 mmHg, the estimated right ventricular systolic pressure is [id number]. Left Atrium: Left atrial size was mildly dilated. Right Atrium: Right atrial size was moderately dilated. Pericardium: There is no evidence of pericardial effusion. Mitral Valve: The mitral valve is normal in structure. Moderate to severe mitral valve regurgitation. No evidence of mitral valve stenosis. Tricuspid Valve: The tricuspid valve is normal in structure. Tricuspid valve regurgitation is mild . No evidence  of  tricuspid stenosis. Aortic Valve: The aortic valve is tricuspid. There is moderate calcification of the aortic valve. There is moderate thickening of the aortic valve. Aortic valve regurgitation is trivial. Mild to moderate aortic valve sclerosis/calcification is present, without any evidence of aortic stenosis. Aortic valve mean gradient measures 5.2 mmHg. Aortic valve peak gradient measures 8.5 mmHg. Aortic valve area, by VTI measures 2.39 cm. Pulmonic Valve: The pulmonic valve was normal in structure. Pulmonic valve regurgitation is not visualized. No evidence of pulmonic stenosis. Aorta: Aortic dilatation noted. There is mild dilatation of the ascending aorta, measuring 44 mm. Venous: The inferior vena cava is dilated in size with greater than 50% respiratory variability, suggesting right atrial pressure of 8 mmHg. IAS/Shunts: No atrial level shunt detected by color flow Doppler.  LEFT VENTRICLE PLAX 2D LVIDd:         6.40 cm      Diastology LVIDs:         6.00 cm      LV e' medial:    6.64 cm/s LV PW:         1.60 cm      LV E/e' medial:  14.9 LV IVS:        1.20 cm      LV e' lateral:   9.79 cm/s LVOT diam:     2.20 cm      LV E/e' lateral: 10.1 LV SV:         46 LV SV Index:   27 LVOT Area:     3.80 cm                              3D Volume EF: LV Volumes (MOD)            3D EF:        21 % LV vol d, MOD A2C: 230.0 ml LV EDV:       251 ml LV vol d, MOD A4C: 220.0 ml LV ESV:       198 ml LV vol s, MOD A2C: 200.0 ml LV SV:        53 ml LV vol s, MOD A4C: 188.0 ml LV SV MOD A2C:     30.0 ml LV SV MOD A4C:     220.0 ml LV SV MOD BP:      30.3 ml RIGHT VENTRICLE            IVC RV S prime:     8.32 cm/s  IVC diam: 2.20 cm TAPSE (M-mode): 0.9 cm LEFT ATRIUM             Index       RIGHT ATRIUM           Index LA diam:        3.10 cm 1.82 cm/m  RA Area:     23.60 cm LA Vol (A2C):   70.7 ml 41.54 ml/m RA Volume:   91.90 ml  54.00 ml/m LA Vol (A4C):   61.4 ml 36.08 ml/m LA Biplane Vol: 71.7 ml 42.13 ml/m   AORTIC VALVE AV Area (Vmax):    3.08 cm AV Area (Vmean):   2.61 cm AV Area (VTI):     2.39 cm AV Vmax:           146.00 cm/s AV Vmean:          98.740 cm/s AV VTI:  0.193 m AV Peak Grad:      8.5 mmHg AV Mean Grad:      5.2 mmHg LVOT Vmax:         118.25 cm/s LVOT Vmean:        67.825 cm/s LVOT VTI:          0.121 m LVOT/AV VTI ratio: 0.63  AORTA Ao Root diam: 3.40 cm Ao Asc diam:  4.40 cm MITRAL VALVE                 TRICUSPID VALVE MV Area (PHT): 8.25 cm      TR Peak grad:   24.4 mmHg MV Decel Time: 92 msec       TR Vmax:        247.00 cm/s MR Peak grad:    104.4 mmHg MR Mean grad:    65.0 mmHg   SHUNTS MR Vmax:         511.00 cm/s Systemic VTI:  0.12 m MR Vmean:        368.0 cm/s  Systemic Diam: 2.20 cm MR PISA:         3.08 cm MR PISA Eff ROA: 23 mm MR PISA Radius:  0.70 cm MV E velocity: 99.00 cm/s Candee Furbish MD Electronically signed by Candee Furbish MD Signature Date/Time: 10/04/2020/11:39:36 AM    Final     Discharge Medications   Allergies as of 10/05/2020   No Known Allergies      Medication List     STOP taking these medications    BC HEADACHE POWDER PO   levofloxacin 500 MG tablet Commonly known as: LEVAQUIN   metoprolol tartrate 25 MG tablet Commonly known as: LOPRESSOR       TAKE these medications    albuterol 108 (90 Base) MCG/ACT inhaler Commonly known as: VENTOLIN HFA Inhale 1-2 puffs into the lungs every 6 (six) hours as needed for wheezing or shortness of breath.   Aspirin Low Dose 81 MG EC tablet Generic drug: aspirin Take 1 tablet (81 mg total) by mouth daily. Swallow whole. Start taking on: October 06, 2020   atorvastatin 80 MG tablet Commonly known as: LIPITOR Take 1 tablet (80 mg total) by mouth daily. Start taking on: October 06, 2020   digoxin 0.125 MG tablet Commonly known as: LANOXIN Take 1 tablet (0.125 mg total) by mouth daily.   hydroxyurea 500 MG capsule Commonly known as: HYDREA Take 1 capsule (500 mg total) by mouth 3  (three) times daily. May take with food to minimize GI side effects. What changed:  how much to take additional instructions   spironolactone 25 MG tablet Commonly known as: ALDACTONE Take 0.5 tablets (12.5 mg total) by mouth daily. Start taking on: October 06, 2020        Disposition   The patient will be discharged in stable condition to home. Discharge Instructions     (HEART FAILURE PATIENTS) Call MD:  Anytime you have any of the following symptoms: 1) 3 pound weight gain in 24 hours or 5 pounds in 1 week 2) shortness of breath, with or without a dry hacking cough 3) swelling in the hands, feet or stomach 4) if you have to sleep on extra pillows at night in order to breathe.   Complete by: As directed    Diet - low sodium heart healthy   Complete by: As directed    Heart Failure patients record your daily weight using the same scale at the same time of  day   Complete by: As directed    Increase activity slowly   Complete by: As directed        [place] [place] [facility]. Schedule an appointment as soon as possible for a visit.   Specialty: Family Medicine Why: To establish care at the [facility] you must fill out the income application for the sliding fee discount before scheduling an appointment. See paperwork provided by the social worker. Contact information: [place] [address] [place] [facility] Follow up on 10/19/2020.   Specialty: Cardiology Why: Advanced Heart Failure Clinic at [place], Garage Code [id number] Contact information: [address] [id number] [place] [phone]                  Duration of Discharge Encounter: Greater than 35 minutes   Signed, [facility], LINDSAY N  10/05/2020, 3:46 PM   Agree with above. [place] for d/c after cMRI. EF down out of proportion to CAD. Await  MRI. F/u in HF Clinic.   Glori Bickers, MD  4:58 PM

## 2020-10-05 NOTE — TOC Initial Note (Signed)
Transition of Care [facility]) - Initial/Assessment Note    Patient Details  Name: Jack Bryan MRN: [id number] Date of Birth: 1955/06/11  Transition of Care [facility]) CM/SW Contact:    Erenest Rasher, RN Phone Number: [phone] 10/05/2020, 12:59 PM  Clinical Narrative:                 HF TOC CM spoke to pt at bedside. States he has hydroxyurea at home and gets from [place] $15. He can afford at pharmacy. States he receives his SS check each month, but just concerned about having insurance to cover MD appt, hospital stay and medications. States his dtrs are supportive and will provide transportation to his appt if needed. Explained [place] program and HF funds to assist with meds. Pt will receive meds from [facility] if dc today or if weekend will utilize [facility].   Expected Discharge Plan: Home/Self Care Barriers to Discharge: No Barriers Identified   Patient Goals and CMS Choice        Expected Discharge Plan and Services Expected Discharge Plan: Home/Self Care In-house Referral: Clinical Social Work Discharge Planning Services: CM Consult, [place] Program, Medication Assistance   Living arrangements for the past 2 months: [place]                                      Prior Living Arrangements/Services Living arrangements for the past 2 months: Single Family Home Lives with:: Adult Children Patient language and need for interpreter reviewed:: Yes Do you feel safe going back to the place where you live?: Yes      Need for Family Participation in Patient Care: No (Comment) Care giver support system in place?: No (comment)   Criminal Activity/Legal Involvement Pertinent to Current Situation/Hospitalization: No - Comment as needed  Activities of Daily Living Home Assistive Devices/Equipment: None ADL Screening (condition at time of admission) Patient's cognitive ability adequate to safely complete daily activities?: Yes Is the patient deaf or have  difficulty hearing?: No Does the patient have difficulty seeing, even when wearing glasses/contacts?: No Does the patient have difficulty concentrating, remembering, or making decisions?: No Patient able to express need for assistance with ADLs?: Yes Does the patient have difficulty dressing or bathing?: No Independently performs ADLs?: Yes (appropriate for developmental age) Does the patient have difficulty walking or climbing stairs?: No Weakness of Legs: None Weakness of Arms/Hands: None  Permission Sought/Granted Permission sought to share information with : Case Manager, Family Supports, PCP Permission granted to share information with : Yes, Verbal Permission Granted  Share Information with NAME: Judas Pettitt     Permission granted to share info w Relationship: daughter  Permission granted to share info w Contact Information: [phone]  Emotional Assessment Appearance:: Appears stated age Attitude/Demeanor/Rapport: Gracious Affect (typically observed): Accepting Orientation: : Oriented to Self, Oriented to Place, Oriented to  Time, Oriented to Situation   Psych Involvement: No (comment)  Admission diagnosis:  Acute on chronic heart failure [facility]) [I50.9] Patient Active Problem List   Diagnosis Date Noted   Acute on chronic heart failure ([place]) 10/04/2020   Thrombocythemia, essential ([place]) 10/14/2016   PCP:  Patient, No Pcp Per (Inactive) Pharmacy:   [facility] [address] [place] [id number] [place] [id number] Phone: [phone] Fax: [phone]     Social Determinants of Health (SDOH) Interventions Food Insecurity Interventions: Assist  with SNAP Application, Other (Comment) (Patient reports he receives $20 in Food Stamps each month which doesn't get him very far but his family helps with food) Financial Strain Interventions: Other (Comment), Development worker, community (Patient reports Disability and Medicaid application started  at [facility] and they should be working on it and didn't want to start another one.) Housing Interventions: Intervention Not Indicated Transportation Interventions: Intervention Not Indicated  Readmission Risk Interventions No flowsheet data found.

## 2020-10-05 NOTE — Progress Notes (Addendum)
Advanced Heart Failure Consult Note  PCP-Cardiologist: None   Subjective:    Admitted from [facility] for Advanced Heart Failure consult.   Feels well today. No dyspnea or CP. Reports good appetite, trouble gaining weight.  Sinus tach on tele.  CARDIAC STUDIES:  R/LHC 09/01 Ao = 104/70 (86) LV = 105/20 RA =  6 RV = 34/8 PA = 33/16 (24) PCW = 15 Fick cardiac output/index = 3.7/2.1 PVR = 2.5 WU SVR = 1753 Ao sat = 95% PA sat = 58%, 62% Assessment: Severe 3v CAD Patent LIMA to LAD and Patent SVG to OM Occluded SVG to RCA but native RCA with mostly nonobstructive CAD EF 20%  Moderately depressed CO with normal filling pressures  Echo 09/01 EF < 20%, moderate LVH, dilated RV with okay RV fx, moderate to severe MR, ascending aorta measures 44 mm  Objective:   Weight Range: 52.3 kg Body mass index is 15.62 kg/m.   Vital Signs:   Temp:  [97.5 F (36.4 C)-98.6 F (37 C)] 97.5 F (36.4 C) (09/02 0500) Pulse Rate:  [110-124] 110 (09/01 1336) Resp:  [13-41] 15 (09/02 0500) BP: (101-147)/(67-102) 111/88 (09/02 0500) SpO2:  [93 %-100 %] 100 % (09/02 0500) Weight:  [52.3 kg-53.5 kg] 52.3 kg (09/02 0500) Last BM Date: 10/04/20  Weight change: Filed Weights   10/04/20 0110 10/04/20 0813 10/05/20 0500  Weight: 53.5 kg 53.5 kg 52.3 kg    Intake/Output:   Intake/Output Summary (Last 24 hours) at 10/05/2020 0751 Last data filed at 10/05/2020 0529 Gross per 24 hour  Intake 354.87 ml  Output 2325 ml  Net -1970.13 ml      Physical Exam    General:  Cachectic. No resp difficulty HEENT: normal Neck: supple. JVP 7-8. Carotids 2+ bilat; no bruits. No lymphadenopathy or thryomegaly appreciated. Cor: PMI nondisplaced. Regular rhythm, tachy. No rubs or gallops, 3/6 holosystolic murmur at apex Lungs: diminished Abdomen: soft, nontender, nondistended. No hepatosplenomegaly. No bruits or masses. Good bowel sounds. Extremities: no cyanosis, clubbing, rash,  edema Neuro: alert & orientedx3, cranial nerves grossly intact. moves all 4 extremities w/o difficulty. Affect pleasant   Telemetry   Sinus Tach 1110s-120s   EKG  No new for review  Labs    CBC Recent Labs    10/04/20 0709 10/04/20 1304 10/04/20 1310 10/04/20 1312  WBC 13.2*  --   --   --   NEUTROABS 10.6*  --   --   --   HGB 13.1   < > 13.3 14.3  HCT 41.0   < > 39.0 42.0  MCV 100.7*  --   --   --   PLT 771*  --   --   --    < > = values in this interval not displayed.   Basic Metabolic Panel Recent Labs    10/04/20 0709 10/04/20 1304 10/04/20 1312 10/05/20 0350  NA 136   < > 140 138  K 3.7   < > 3.7 4.2  CL 97*  --   --  97*  CO2 30  --   --  33*  GLUCOSE 101*  --   --  104*  BUN 17  --   --  20  CREATININE 0.65  --   --  0.82  CALCIUM 8.7*  --   --  9.3  MG 2.1  --   --   --    < > = values in this interval not displayed.  Liver Function Tests Recent Labs    10/04/20 0709  AST 20  ALT 27  ALKPHOS 80  BILITOT 0.9  PROT 5.8*  ALBUMIN 3.3*   No results for input(s): LIPASE, AMYLASE in the last 72 hours. Cardiac Enzymes No results for input(s): CKTOTAL, CKMB, CKMBINDEX, TROPONINI in the last 72 hours.  BNP: BNP (last 3 results) Recent Labs    10/04/20 0709  BNP 1,084.9*    ProBNP (last 3 results) No results for input(s): PROBNP in the last 8760 hours.   D-Dimer No results for input(s): DDIMER in the last 72 hours. Hemoglobin A1C No results for input(s): HGBA1C in the last 72 hours. Fasting Lipid Panel No results for input(s): CHOL, HDL, LDLCALC, TRIG, CHOLHDL, LDLDIRECT in the last 72 hours. Thyroid Function Tests Recent Labs    10/04/20 0709  TSH 3.921    Other results:   Imaging    CARDIAC CATHETERIZATION  Result Date: 10/04/2020   Origin lesion is 100% stenosed.   Ost Cx to Prox Cx lesion is 100% stenosed.   Prox Cx to Mid Cx lesion is 80% stenosed.   Prox LAD lesion is 95% stenosed.   Prox RCA to Mid RCA lesion is 40%  stenosed.   1st RPL lesion is 100% stenosed.   RPAV lesion is 40% stenosed.   The left ventricular ejection fraction is less than 25% by visual estimate. Findings: Ao = 104/70 (86) LV = 105/20 RA =  6 RV = 34/8 PA = 33/16 (24) PCW = 15 Fick cardiac output/index = 3.7/2.1 PVR = 2.5 WU SVR = 1753 Ao sat = 95% PA sat = 58%, 62% Assessment: Severe 3v CAD Patent LIMA to LAD and Patent SVG to OM Occluded SVG to RCA but native RCA with mostly nonobstructive CAD EF 20%  Moderately depressed CO with normal filling pressures Plan/Discussion: Will check cMRI to further evaluate evaluate cardiomyopathy. Otherwise medical therapy. Glori Bickers, MD 1:57 PM  ECHOCARDIOGRAM COMPLETE  Result Date: 10/04/2020    ECHOCARDIOGRAM REPORT   Patient Name:   Jack Bryan Date of Exam: 10/04/2020 Medical Rec #:  [id number]            Height:       72.0 in Accession #:    [id number]           Weight:       117.9 lb Date of Birth:  [birth year]            BSA:          1.702 m Patient Age:    65 years             BP:           125/85 mmHg Patient Gender: M                    HR:           117 bpm. Exam Location:  Inpatient Procedure: 2D Echo, 3D Echo, Cardiac Doppler and Color Doppler Indications:    I50.40* Unspecified combined systolic (congestive) and diastolic                 (congestive) heart failure  History:        Patient has no prior history of Echocardiogram examinations.  Sonographer:    Roseanna Rainbow RDCS Referring Phys: [id number] [place]  1. Left ventricular ejection fraction, by estimation, is <20%. The left ventricle has severely decreased function. The left ventricle  demonstrates global hypokinesis. The left ventricular internal cavity size was moderately dilated. There is moderate left ventricular hypertrophy. Left ventricular diastolic parameters are indeterminate.  2. Right ventricular systolic function is normal. The right ventricular size is moderately enlarged. There is normal pulmonary artery  systolic pressure.  3. Left atrial size was mildly dilated.  4. Right atrial size was moderately dilated.  5. The mitral valve is normal in structure. Moderate to severe mitral valve regurgitation. No evidence of mitral stenosis.  6. The aortic valve is tricuspid. There is moderate calcification of the aortic valve. There is moderate thickening of the aortic valve. Aortic valve regurgitation is trivial. Mild to moderate aortic valve sclerosis/calcification is present, without any  evidence of aortic stenosis.  7. Aortic dilatation noted. There is mild dilatation of the ascending aorta, measuring 44 mm.  8. The inferior vena cava is dilated in size with >50% respiratory variability, suggesting right atrial pressure of 8 mmHg. FINDINGS  Left Ventricle: Left ventricular ejection fraction, by estimation, is <20%. The left ventricle has severely decreased function. The left ventricle demonstrates global hypokinesis. The left ventricular internal cavity size was moderately dilated. There is moderate left ventricular hypertrophy. Left ventricular diastolic parameters are indeterminate. Right Ventricle: The right ventricular size is moderately enlarged. No increase in right ventricular wall thickness. Right ventricular systolic function is normal. There is normal pulmonary artery systolic pressure. The tricuspid regurgitant velocity is 2.47 m/s, and with an assumed right atrial pressure of 3 mmHg, the estimated right ventricular systolic pressure is [id number]. Left Atrium: Left atrial size was mildly dilated. Right Atrium: Right atrial size was moderately dilated. Pericardium: There is no evidence of pericardial effusion. Mitral Valve: The mitral valve is normal in structure. Moderate to severe mitral valve regurgitation. No evidence of mitral valve stenosis. Tricuspid Valve: The tricuspid valve is normal in structure. Tricuspid valve regurgitation is mild . No evidence of tricuspid stenosis. Aortic Valve: The aortic valve  is tricuspid. There is moderate calcification of the aortic valve. There is moderate thickening of the aortic valve. Aortic valve regurgitation is trivial. Mild to moderate aortic valve sclerosis/calcification is present, without any evidence of aortic stenosis. Aortic valve mean gradient measures 5.2 mmHg. Aortic valve peak gradient measures 8.5 mmHg. Aortic valve area, by VTI measures 2.39 cm. Pulmonic Valve: The pulmonic valve was normal in structure. Pulmonic valve regurgitation is not visualized. No evidence of pulmonic stenosis. Aorta: Aortic dilatation noted. There is mild dilatation of the ascending aorta, measuring 44 mm. Venous: The inferior vena cava is dilated in size with greater than 50% respiratory variability, suggesting right atrial pressure of 8 mmHg. IAS/Shunts: No atrial level shunt detected by color flow Doppler.  LEFT VENTRICLE PLAX 2D LVIDd:         6.40 cm      Diastology LVIDs:         6.00 cm      LV e' medial:    6.64 cm/s LV PW:         1.60 cm      LV E/e' medial:  14.9 LV IVS:        1.20 cm      LV e' lateral:   9.79 cm/s LVOT diam:     2.20 cm      LV E/e' lateral: 10.1 LV SV:         46 LV SV Index:   27 LVOT Area:     3.80 cm  3D Volume EF: LV Volumes (MOD)            3D EF:        21 % LV vol d, MOD A2C: 230.0 ml LV EDV:       251 ml LV vol d, MOD A4C: 220.0 ml LV ESV:       198 ml LV vol s, MOD A2C: 200.0 ml LV SV:        53 ml LV vol s, MOD A4C: 188.0 ml LV SV MOD A2C:     30.0 ml LV SV MOD A4C:     220.0 ml LV SV MOD BP:      30.3 ml RIGHT VENTRICLE            IVC RV S prime:     8.32 cm/s  IVC diam: 2.20 cm TAPSE (M-mode): 0.9 cm LEFT ATRIUM             Index       RIGHT ATRIUM           Index LA diam:        3.10 cm 1.82 cm/m  RA Area:     23.60 cm LA Vol (A2C):   70.7 ml 41.54 ml/m RA Volume:   91.90 ml  54.00 ml/m LA Vol (A4C):   61.4 ml 36.08 ml/m LA Biplane Vol: 71.7 ml 42.13 ml/m  AORTIC VALVE AV Area (Vmax):    3.08 cm AV Area  (Vmean):   2.61 cm AV Area (VTI):     2.39 cm AV Vmax:           146.00 cm/s AV Vmean:          98.740 cm/s AV VTI:            0.193 m AV Peak Grad:      8.5 mmHg AV Mean Grad:      5.2 mmHg LVOT Vmax:         118.25 cm/s LVOT Vmean:        67.825 cm/s LVOT VTI:          0.121 m LVOT/AV VTI ratio: 0.63  AORTA Ao Root diam: 3.40 cm Ao Asc diam:  4.40 cm MITRAL VALVE                 TRICUSPID VALVE MV Area (PHT): 8.25 cm      TR Peak grad:   24.4 mmHg MV Decel Time: 92 msec       TR Vmax:        247.00 cm/s MR Peak grad:    104.4 mmHg MR Mean grad:    65.0 mmHg   SHUNTS MR Vmax:         511.00 cm/s Systemic VTI:  0.12 m MR Vmean:        368.0 cm/s  Systemic Diam: 2.20 cm MR PISA:         3.08 cm MR PISA Eff ROA: 23 mm MR PISA Radius:  0.70 cm MV E velocity: 99.00 cm/s Candee Furbish MD Electronically signed by Candee Furbish MD Signature Date/Time: 10/04/2020/11:39:36 AM    Final      Medications:     Scheduled Medications:  acetaminophen       aspirin EC  81 mg Oral Daily   atorvastatin  80 mg Oral Daily   chlorhexidine       enoxaparin (LOVENOX) injection  40 mg Subcutaneous Q24H   hydroxyurea  500 mg Oral TID  melatonin  3 mg Oral QHS   sodium chloride flush  3 mL Intravenous Q12H   sodium chloride flush  3 mL Intravenous Q12H   sodium chloride flush  3 mL Intravenous Q12H    Infusions:  sodium chloride     sodium chloride     ceFAZolin      PRN Medications: sodium chloride, sodium chloride, acetaminophen, ondansetron (ZOFRAN) IV, oxyCODONE, sodium chloride flush, sodium chloride flush    Patient Profile  Jack Bryan is a 65 y.o. male with COPD, CABG 2018, ongoing tobacco use, LUL pulmonary nodule (1.2 cm),  thrombocytosis, malnutrition, and chronic systolic heart failure.   Admitted from [facility] for advanced heart failure management.   Assessment/Plan   Acute / Chronic Systolic Heart Failure/?ICM or mixed ICM/nonischemic -Had CABG 2018 . ECHO in 2018 EF 20% per Care  Everywhere.  Echo at [facility] with EF < 20% possible noncompaction.  - Echo here with EF < 20%, moderate LVH, dilated RV with okay fxn, moderate to severe MR - cMRI pending - He does not appear volume overloaded. RA 6 and PCWP 15 on RHC yesterday. Moderately decreased CO.  - No bb for now.  - add dig - add low dose spiro - consider ARNI, blood pressure a little soft today - cMRI pending - Will need aggressive medical management.  - Would not be a candidate for advanced therapies with cachexia. Will check prealbumin.  2. Sinus Tach -TSH okay -Moderately depressed CO on RHC  3. Mitral valve regurgitation: - Moderate to severe on echo this admit - Will need to follow  4. S/P R thoracentesis at [place] this am - Will need to find cytology report especially with history of LUL pulmonary nodule.  - Dr Haroldine Laws reaching out to Dr Harriet Masson to see we can get cytology.   5. Tobacco Abuse -Discussed cessation   6. Malnutrition - Check prealbumin.  - Consult dietitian  7.  Thrombocytosis -On hydroxyurea  -platelets 771 -Followed by Dr Hinton Rao in [place]  8. COPD  9. H/O TIA 2022  10. Pulmonary Nodule LUL - Will need outpatient workup  11. CAD  - S/P CABG 2018  LIMA --> LAD, SVG --> PDA, and SVG --> OM  - LHC 09/01 - patent grafts with exception of occluded SVG to RCA, native RCA with mostly nonobstructive CAD - He does not have established cardiologist.  - On asa and statin.    SDOH: On fixed income. Often obtains meals through [employer] or crisis center. Difficulty paying for meds. Consult TOC CM/CSW.  Length of Stay: 1  FINCH, LINDSAY N, PA-C  10/05/2020, 7:51 AM  Advanced Heart Failure Team Pager [phone] (M-F; 7a - 5p)  Please contact [facility] for night-coverage after hours (5p -7a ) and weekends on [email or web address]  Patient seen and examined with the above-signed Advanced Practice Provider and/or Housestaff. I personally reviewed laboratory  data, imaging studies and relevant notes. I independently examined the patient and formulated the important aspects of the plan. I have edited the note to reflect any of my changes or salient points. I have personally discussed the plan with the patient and/or family.   Feels good. No further SOB. Cath results reviewed with him.  Pending cMRI  General:  Sitting up in bed  Cachectic No resp difficulty HEENT: normal Neck: supple. no JVD. Carotids 2+ bilat; no bruits. No lymphadenopathy or thryomegaly appreciated. Cor: PMI nondisplaced. Tachy regular 2/6 MR Lungs: decreased throughout Abdomen: soft,  nontender, nondistended. No hepatosplenomegaly. No bruits or masses. Good bowel sounds. Extremities: no cyanosis, clubbing, rash, edema Neuro: alert & orientedx3, cranial nerves grossly intact. moves all 4 extremities w/o difficulty. Affect pleasant  Agree with adding digoxin and spiro. Plan cMRI today. Possibly home after. I d/w Dr. Harriet Masson about trying to get cytology on pleural fluid at [facility].   Glori Bickers, MD  9:17 AM

## 2020-10-05 NOTE — Progress Notes (Signed)
Initial Nutrition Assessment  DOCUMENTATION CODES:   Underweight, Severe malnutrition in context of social or environmental circumstances  INTERVENTION:   -Ensure Enlive po TID, each supplement provides 350 kcal and 20 grams of protein  -MVI with minerals daily -Case discussed with Education officer, museum; plans to provide food assistance resources  NUTRITION DIAGNOSIS:   Severe Malnutrition related to social / environmental circumstances as evidenced by severe fat depletion, severe muscle depletion.  GOAL:   Patient will meet greater than or equal to 90% of their needs  MONITOR:   PO intake, Supplement acceptance, Diet advancement, Labs, Weight trends, Skin, I & O's  REASON FOR ASSESSMENT:   Consult Assessment of nutrition requirement/status  ASSESSMENT:   Jack Bryan is a 65 y.o. male with COPD, CABG 2018, ongoing tobacco use, LUL pulmonary nodule (1.2 cm), and recently discovered HFrEF who is being seen 10/04/2020 for the evaluation of heart failure.  Pt admitted with CHF.   Reviewed I/O's: -2.1 L x 24 hours and -1.8 L since admission  UOP: 2.4 L x 24 hours  Spoke with pt at bedside, who was pleasant and in good spirits today. He reports he has a good appetite, consuming 80% of his lunch tray.   PTA pt reports he consumes small frequent meals, such as cheese and peanut butter sandwiches. He complains of early satiety, which he attributes to working his career in [employer]. His daughter provides him with a meat, starch, and vegetable meal daily at dinner. Pt shares with this RD that he has limited resources for food. He receives $1400/month in retirement, most of which goes toward medical expenses, car, and electricity. He has been supplementing his food with donations from the food bank. Per pt, he also receive $20/ month in food stamps, which his daughter usually uses for Ensure supplements (pt consumes 2-3 per day).   Per pt, he has always been small framed, but lost  20# in 2018 when he underwent heart surgery (pt also lost his wife the same year). He has been unable to regain lost weight despite efforts. Discussed ways to increase calories and protein in diet and ways he could incorporate food donations to make diet more nutritionally dense (ex adding canned beans and peanut butter to diet). Pt also amenable to drink Ensure during hospitalization; RD provided pt with one.   Case discussed with Education officer, museum, who will provide pt with additional food assistance resources.    Medications reviewed and include melatonin.  Labs reviewed.   NUTRITION - FOCUSED PHYSICAL EXAM:  Flowsheet Row Most Recent Value  Orbital Region Severe depletion  Upper Arm Region Severe depletion  Thoracic and Lumbar Region Severe depletion  Buccal Region Severe depletion  Temple Region Severe depletion  Clavicle Bone Region Severe depletion  Clavicle and Acromion Bone Region Severe depletion  Scapular Bone Region Severe depletion  Dorsal Hand Severe depletion  Patellar Region Severe depletion  Anterior Thigh Region Severe depletion  Posterior Calf Region Severe depletion  Edema (RD Assessment) None  Hair Reviewed  Eyes Reviewed  Mouth Reviewed  Skin Reviewed  Nails Reviewed       Diet Order:   Diet Order             Diet - low sodium heart healthy           Diet Heart Room service appropriate? Yes; Fluid consistency: Thin  Diet effective now  EDUCATION NEEDS:   Education needs have been addressed  Skin:  Skin Assessment: Reviewed RN Assessment  Last BM:  10/04/20  Height:   Ht Readings from Last 1 Encounters:  10/04/20 6' (1.829 m)    Weight:   Wt Readings from Last 1 Encounters:  10/05/20 52.3 kg    Ideal Body Weight:  80.9 kg  BMI:  Body mass index is 15.62 kg/m.  Estimated Nutritional Needs:   Kcal:  2100-2300  Protein:  115-130 grams  Fluid:  > 2 L    Loistine Chance, RD, LDN, [place] Registered Dietitian  II Certified Diabetes Care and Education Specialist Please refer to [facility] for RD and/or RD on-call/weekend/after hours pager

## 2020-10-05 NOTE — TOC Progression Note (Signed)
Transition of Care (TOC) - Progression Note  Heart Failure   Patient Details  Name: Jack Bryan MRN: [id number] Date of Birth: 1955/10/02  Transition of Care [facility]) CM/SW [place], [place] Phone Number: 10/05/2020, 10:54 AM  Clinical Narrative:    HF CSW spoke with [name] at bedside about the [facility] clinic for primary care and provided him with a financial application that he will need to fill out and bring to the clinic so he can schedule a PCP appointment. CSW attempted to schedule [name] an appointment over the phone with [facility] clinic but reported they need the financial paperwork first from the patient. PCP information is located on [name]'s AVS and CSW provided him with the application to fill out and bring to the clinic. [name] reported that his daughter will provide transportation home today at discharge.      Barriers to Discharge: Continued Medical Work up  Expected Discharge Plan and Services   In-house Referral: Clinical Social Work     Living arrangements for the past 2 months: Single Family Home                                       Social Determinants of Health (SDOH) Interventions Food Insecurity Interventions: Assist with [employer] Application, Other (Comment) (Patient reports he receives $20 in [employer] Stamps each month which doesn't get him very far but his family helps with food) Financial Strain Interventions: Other (Comment), Development worker, community (Patient reports Disability and Medicaid application started at [facility] and they should be working on it and didn't want to start another one.) Housing Interventions: Intervention Not Indicated Transportation Interventions: Intervention Not Indicated  Readmission Risk Interventions No flowsheet data found.  Omaya Nieland, MSW, [place] Heart Failure Social Worker

## 2020-10-09 ENCOUNTER — Telehealth: Payer: Self-pay | Admitting: Oncology

## 2020-10-09 NOTE — Telephone Encounter (Signed)
Patient's daughter called to let us know he passed away on 05-22-22 - Would like a call back from Dr Hinton Rao when you have the chance

## 2020-10-19 ENCOUNTER — Encounter (HOSPITAL_COMMUNITY): Payer: Medicaid Other

## 2020-11-03 DEATH — deceased

## 2022-09-15 IMAGING — DX DG CHEST 1V PORT
1 series · 1 of 1 positions shown · non-contrast
Comparison: October 02, 2020.

CLINICAL DATA: Acute on chronic heart failure.

EXAM:
PORTABLE CHEST 1 VIEW

[chest]
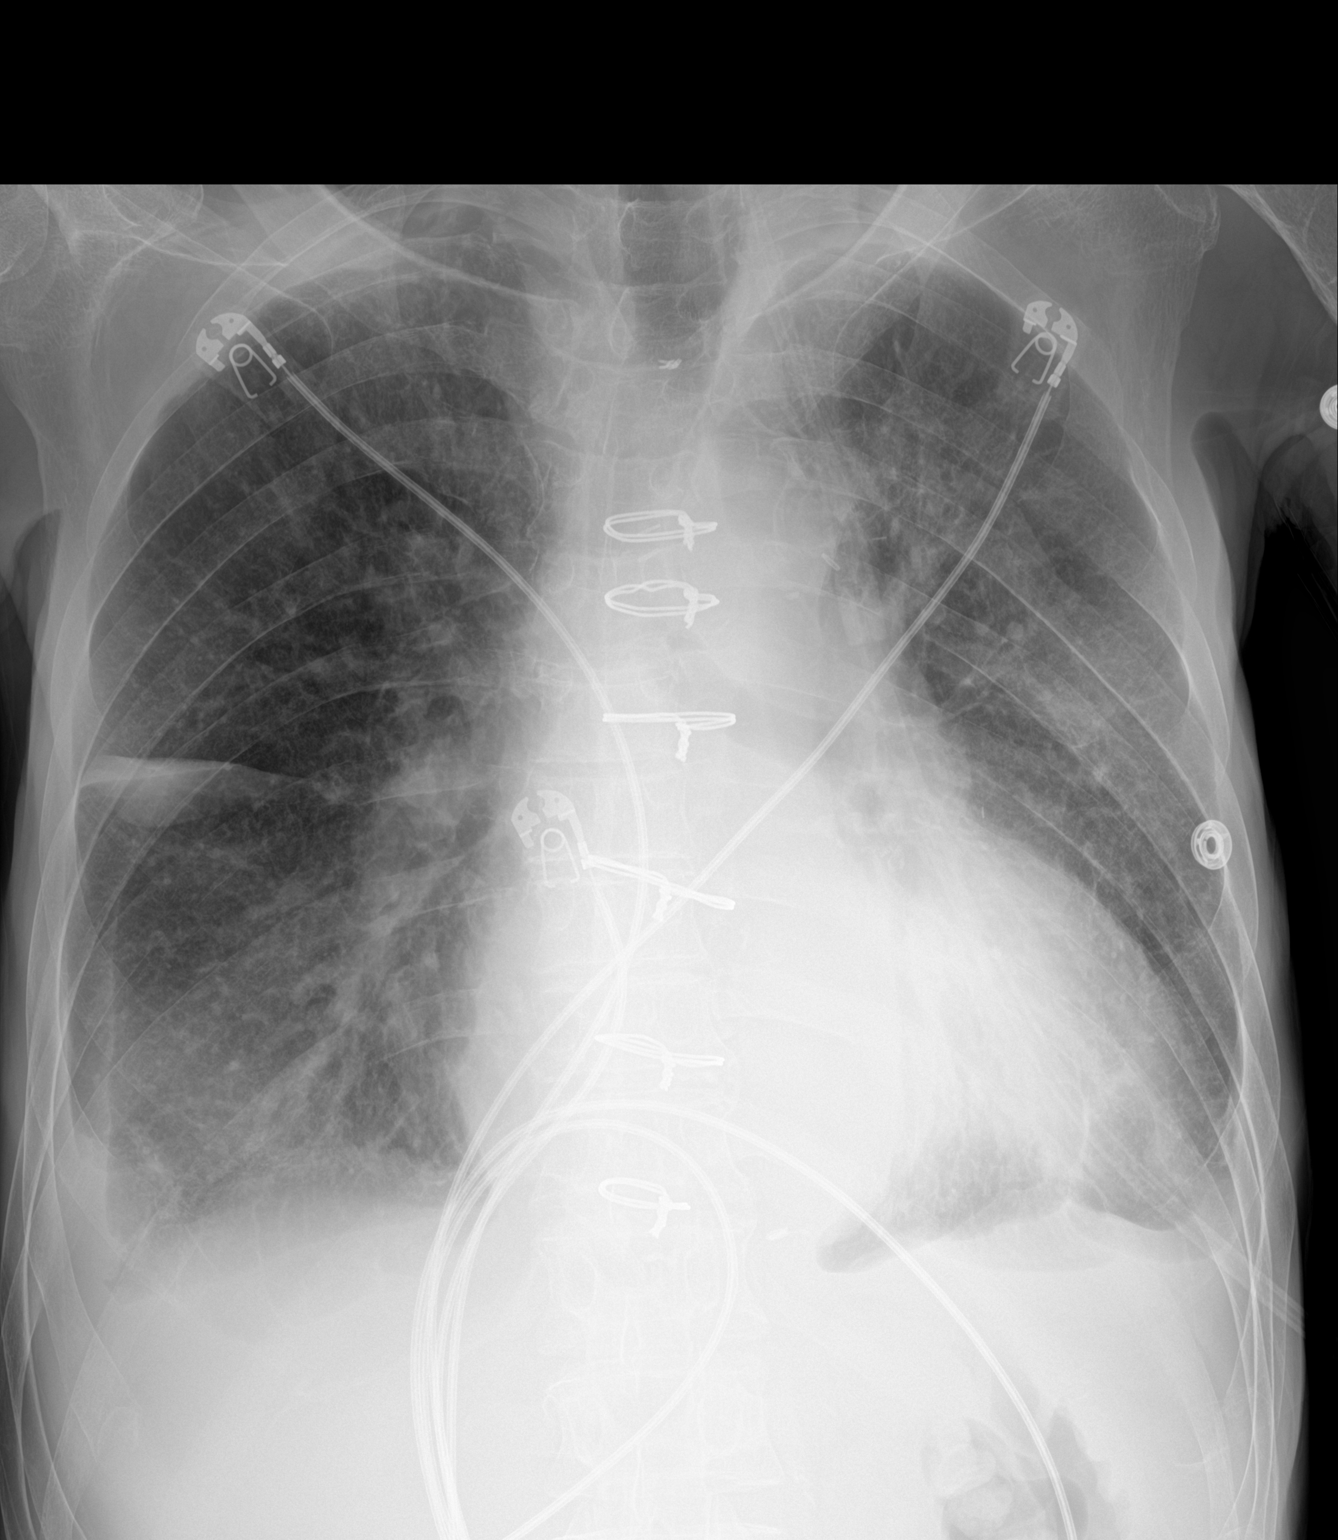

[1 of 1 positions shown; findings below may reference images not displayed]

FINDINGS: Stable cardiomegaly. Sternotomy wires are noted. Mild bibasilar
subsegmental atelectasis is noted with small pleural effusions.
Fluid is noted in the right minor fissure. No pneumothorax is noted.
Bony thorax is unremarkable.
IMPRESSION: Mild bibasilar subsegmental atelectasis is noted with small
bilateral pleural effusions.
